# Patient Record
Sex: Male | Born: 1935 | Race: White | Hispanic: No | Marital: Single | Smoking: Former smoker
Health system: Southern US, Community
[De-identification: ages and names within clinical notes are randomized; demographics above are authoritative.]

## PROBLEM LIST (undated history)

## (undated) DIAGNOSIS — I8390 Asymptomatic varicose veins of unspecified lower extremity: Secondary | ICD-10-CM

## (undated) DIAGNOSIS — I10 Essential (primary) hypertension: Secondary | ICD-10-CM

## (undated) DIAGNOSIS — I82409 Acute embolism and thrombosis of unspecified deep veins of unspecified lower extremity: Secondary | ICD-10-CM

## (undated) DIAGNOSIS — G20 Parkinson's disease: Secondary | ICD-10-CM

## (undated) DIAGNOSIS — Z7409 Other reduced mobility: Secondary | ICD-10-CM

## (undated) DIAGNOSIS — Z9181 History of falling: Secondary | ICD-10-CM

## (undated) DIAGNOSIS — E538 Deficiency of other specified B group vitamins: Secondary | ICD-10-CM

## (undated) DIAGNOSIS — G8929 Other chronic pain: Secondary | ICD-10-CM

## (undated) DIAGNOSIS — N189 Chronic kidney disease, unspecified: Secondary | ICD-10-CM

## (undated) DIAGNOSIS — F419 Anxiety disorder, unspecified: Secondary | ICD-10-CM

## (undated) DIAGNOSIS — M329 Systemic lupus erythematosus, unspecified: Secondary | ICD-10-CM

## (undated) DIAGNOSIS — C801 Malignant (primary) neoplasm, unspecified: Secondary | ICD-10-CM

## (undated) DIAGNOSIS — G20A1 Parkinson's disease without dyskinesia, without mention of fluctuations: Secondary | ICD-10-CM

## (undated) HISTORY — DX: Other chronic pain: G89.29

## (undated) HISTORY — DX: Systemic lupus erythematosus, unspecified: M32.9

## (undated) HISTORY — DX: Chronic kidney disease, unspecified: N18.9

## (undated) HISTORY — DX: History of falling: Z91.81

## (undated) HISTORY — DX: Other reduced mobility: Z74.09

## (undated) HISTORY — DX: Parkinson's disease without dyskinesia, without mention of fluctuations: G20.A1

## (undated) HISTORY — DX: Malignant (primary) neoplasm, unspecified: C80.1

## (undated) HISTORY — PX: COLON SURGERY: SHX602

## (undated) HISTORY — DX: Acute embolism and thrombosis of unspecified deep veins of unspecified lower extremity: I82.409

## (undated) HISTORY — DX: Deficiency of other specified B group vitamins: E53.8

## (undated) HISTORY — DX: Anxiety disorder, unspecified: F41.9

## (undated) HISTORY — DX: Essential (primary) hypertension: I10

## (undated) HISTORY — PX: HERNIA REPAIR: SHX51

## (undated) HISTORY — DX: Asymptomatic varicose veins of unspecified lower extremity: I83.90

## (undated) HISTORY — DX: Parkinson's disease: G20

---

## 1997-10-08 ENCOUNTER — Ambulatory Visit (HOSPITAL_BASED_OUTPATIENT_CLINIC_OR_DEPARTMENT_OTHER): Admission: RE | Admit: 1997-10-08 | Discharge: 1997-10-08 | Payer: Self-pay | Admitting: Orthopedic Surgery

## 2003-01-01 ENCOUNTER — Ambulatory Visit (HOSPITAL_BASED_OUTPATIENT_CLINIC_OR_DEPARTMENT_OTHER): Admission: RE | Admit: 2003-01-01 | Discharge: 2003-01-01 | Payer: Self-pay | Admitting: Internal Medicine

## 2008-03-03 ENCOUNTER — Ambulatory Visit: Payer: Self-pay | Admitting: Oncology

## 2008-03-15 LAB — CBC WITH DIFFERENTIAL/PLATELET
BASO%: 0.4 % (ref 0.0–2.0)
Basophils Absolute: 0 10e3/uL (ref 0.0–0.1)
EOS%: 1.5 % (ref 0.0–7.0)
Eosinophils Absolute: 0.1 10e3/uL (ref 0.0–0.5)
HCT: 42.2 % (ref 38.7–49.9)
HGB: 14.3 g/dL (ref 13.0–17.1)
LYMPH%: 25.4 % (ref 14.0–48.0)
MCH: 31.9 pg (ref 28.0–33.4)
MCHC: 33.9 g/dL (ref 32.0–35.9)
MCV: 94.2 fL (ref 81.6–98.0)
MONO#: 0.4 10e3/uL (ref 0.1–0.9)
MONO%: 6.8 % (ref 0.0–13.0)
NEUT#: 3.9 10e3/uL (ref 1.5–6.5)
NEUT%: 65.9 % (ref 40.0–75.0)
Platelets: 226 10e3/uL (ref 145–400)
RBC: 4.48 10e6/uL (ref 4.20–5.71)
RDW: 14.4 % (ref 11.2–14.6)
WBC: 5.9 10e3/uL (ref 4.0–10.0)
lymph#: 1.5 10e3/uL (ref 0.9–3.3)

## 2008-03-18 LAB — HYPERCOAGULABLE PANEL, COMPREHENSIVE RET.
AntiThromb III Func: 109 % (ref 76–126)
Anticardiolipin IgA: <7 [APL'U] (ref <13)
Anticardiolipin IgM: <7 [MPL'U] (ref <10)
Beta-2 Glyco I IgG: <4 U/mL (ref <20)
Beta-2-Glycoprotein I IgA: <4 U/mL (ref <10)
Beta-2-Glycoprotein I IgM: <4 U/mL (ref <10)
Protein S Activity: 47 % — ABNORMAL LOW (ref 69–129)

## 2008-03-18 LAB — COMPREHENSIVE METABOLIC PANEL
BUN: 12 mg/dL (ref 6–23)
CO2: 24 mEq/L (ref 19–32)
Calcium: 9.6 mg/dL (ref 8.4–10.5)
Chloride: 104 mEq/L (ref 96–112)
Creatinine, Ser: 1.01 mg/dL (ref 0.40–1.50)
Glucose, Bld: 98 mg/dL (ref 70–99)

## 2008-04-20 ENCOUNTER — Ambulatory Visit: Payer: Self-pay | Admitting: Oncology

## 2010-04-02 ENCOUNTER — Encounter: Payer: Self-pay | Admitting: Thoracic Surgery

## 2010-10-03 ENCOUNTER — Other Ambulatory Visit: Payer: Self-pay | Admitting: Neurosurgery

## 2010-10-03 DIAGNOSIS — M79603 Pain in arm, unspecified: Secondary | ICD-10-CM

## 2010-10-03 DIAGNOSIS — M542 Cervicalgia: Secondary | ICD-10-CM

## 2010-10-06 MED ORDER — DIAZEPAM 2 MG PO TABS
5.0000 mg | ORAL_TABLET | Freq: Once | ORAL | Status: AC | PRN
  Administered 2010-10-09: 5 mg via ORAL

## 2010-10-09 ENCOUNTER — Ambulatory Visit
Admission: RE | Admit: 2010-10-09 | Discharge: 2010-10-09 | Disposition: A | Discharge status: Home / Self Care | Payer: Self-pay | Source: Ambulatory Visit | Attending: Neurosurgery | Admitting: Neurosurgery

## 2010-10-09 DIAGNOSIS — M542 Cervicalgia: Secondary | ICD-10-CM

## 2010-10-09 DIAGNOSIS — M79603 Pain in arm, unspecified: Secondary | ICD-10-CM

## 2010-10-09 MED ORDER — MEPERIDINE HCL 100 MG/ML IJ SOLN
75.0000 mg | Freq: Once | INTRAMUSCULAR | Status: AC
  Administered 2010-10-09: 75 mg via INTRAMUSCULAR

## 2010-10-09 MED ORDER — SODIUM CHLORIDE 0.9 % IV SOLN: 4.0000 mg | Freq: Four times a day (QID) | INTRAVENOUS | Status: DC | PRN

## 2010-10-09 MED ORDER — IOHEXOL 300 MG/ML  SOLN
10.0000 mL | Freq: Once | INTRAMUSCULAR | Status: AC | PRN
  Administered 2010-10-09: 10 mL via INTRATHECAL

## 2010-10-09 MED ORDER — DEXTROSE-NACL 5-0.45 % IV SOLN: INTRAVENOUS | Status: DC

## 2010-10-09 MED ORDER — ONDANSETRON HCL 4 MG/2ML IJ SOLN
4.0000 mg | Freq: Once | INTRAMUSCULAR | Status: AC
  Administered 2010-10-09: 4 mg via INTRAMUSCULAR

## 2010-10-09 NOTE — Progress Notes (Signed)
Pt "much more comfortable" after CT and upright films "since pain medicine working now."  jkl

## 2010-12-04 ENCOUNTER — Other Ambulatory Visit (HOSPITAL_COMMUNITY): Payer: Self-pay

## 2010-12-05 ENCOUNTER — Inpatient Hospital Stay (HOSPITAL_COMMUNITY): Admission: RE | Admit: 2010-12-05 | Payer: Medicare Other | Source: Ambulatory Visit | Admitting: Neurosurgery

## 2011-03-19 DIAGNOSIS — R791 Abnormal coagulation profile: Secondary | ICD-10-CM | POA: Diagnosis not present

## 2011-04-05 DIAGNOSIS — M542 Cervicalgia: Secondary | ICD-10-CM | POA: Diagnosis not present

## 2011-04-05 DIAGNOSIS — Z7901 Long term (current) use of anticoagulants: Secondary | ICD-10-CM | POA: Diagnosis not present

## 2011-04-05 DIAGNOSIS — Z0000 Encounter for general adult medical examination without abnormal findings: Secondary | ICD-10-CM | POA: Diagnosis not present

## 2011-04-05 DIAGNOSIS — N402 Nodular prostate without lower urinary tract symptoms: Secondary | ICD-10-CM | POA: Diagnosis not present

## 2011-04-05 DIAGNOSIS — E782 Mixed hyperlipidemia: Secondary | ICD-10-CM | POA: Diagnosis not present

## 2011-04-11 DIAGNOSIS — R1013 Epigastric pain: Secondary | ICD-10-CM | POA: Diagnosis not present

## 2011-04-11 DIAGNOSIS — R198 Other specified symptoms and signs involving the digestive system and abdomen: Secondary | ICD-10-CM | POA: Diagnosis not present

## 2011-04-12 DIAGNOSIS — M542 Cervicalgia: Secondary | ICD-10-CM | POA: Diagnosis not present

## 2011-04-12 DIAGNOSIS — M4712 Other spondylosis with myelopathy, cervical region: Secondary | ICD-10-CM | POA: Diagnosis not present

## 2011-04-12 DIAGNOSIS — M5412 Radiculopathy, cervical region: Secondary | ICD-10-CM | POA: Diagnosis not present

## 2011-04-13 DIAGNOSIS — R1013 Epigastric pain: Secondary | ICD-10-CM | POA: Diagnosis not present

## 2011-04-13 DIAGNOSIS — Q618 Other cystic kidney diseases: Secondary | ICD-10-CM | POA: Diagnosis not present

## 2011-04-16 DIAGNOSIS — Z029 Encounter for administrative examinations, unspecified: Secondary | ICD-10-CM | POA: Diagnosis not present

## 2011-05-30 DIAGNOSIS — G20 Parkinson's disease: Secondary | ICD-10-CM | POA: Diagnosis not present

## 2011-05-30 DIAGNOSIS — R413 Other amnesia: Secondary | ICD-10-CM | POA: Diagnosis not present

## 2011-05-30 DIAGNOSIS — R259 Unspecified abnormal involuntary movements: Secondary | ICD-10-CM | POA: Diagnosis not present

## 2011-06-19 DIAGNOSIS — I10 Essential (primary) hypertension: Secondary | ICD-10-CM | POA: Diagnosis not present

## 2011-06-19 DIAGNOSIS — E785 Hyperlipidemia, unspecified: Secondary | ICD-10-CM | POA: Diagnosis not present

## 2011-06-19 DIAGNOSIS — I252 Old myocardial infarction: Secondary | ICD-10-CM | POA: Diagnosis not present

## 2011-06-19 DIAGNOSIS — I2510 Atherosclerotic heart disease of native coronary artery without angina pectoris: Secondary | ICD-10-CM | POA: Diagnosis not present

## 2011-06-21 DIAGNOSIS — I2510 Atherosclerotic heart disease of native coronary artery without angina pectoris: Secondary | ICD-10-CM | POA: Diagnosis not present

## 2011-06-21 DIAGNOSIS — R0602 Shortness of breath: Secondary | ICD-10-CM | POA: Diagnosis not present

## 2011-07-03 DIAGNOSIS — E860 Dehydration: Secondary | ICD-10-CM | POA: Diagnosis not present

## 2011-07-03 DIAGNOSIS — R079 Chest pain, unspecified: Secondary | ICD-10-CM | POA: Diagnosis not present

## 2011-07-03 DIAGNOSIS — G20 Parkinson's disease: Secondary | ICD-10-CM | POA: Diagnosis not present

## 2011-07-03 DIAGNOSIS — R259 Unspecified abnormal involuntary movements: Secondary | ICD-10-CM | POA: Diagnosis not present

## 2011-07-03 DIAGNOSIS — L259 Unspecified contact dermatitis, unspecified cause: Secondary | ICD-10-CM | POA: Diagnosis not present

## 2011-07-03 DIAGNOSIS — E7800 Pure hypercholesterolemia, unspecified: Secondary | ICD-10-CM | POA: Diagnosis not present

## 2011-07-03 DIAGNOSIS — I10 Essential (primary) hypertension: Secondary | ICD-10-CM | POA: Diagnosis not present

## 2011-07-03 DIAGNOSIS — R5381 Other malaise: Secondary | ICD-10-CM | POA: Diagnosis not present

## 2011-07-17 DIAGNOSIS — I10 Essential (primary) hypertension: Secondary | ICD-10-CM | POA: Diagnosis not present

## 2011-07-17 DIAGNOSIS — I2510 Atherosclerotic heart disease of native coronary artery without angina pectoris: Secondary | ICD-10-CM | POA: Diagnosis not present

## 2011-07-17 DIAGNOSIS — E785 Hyperlipidemia, unspecified: Secondary | ICD-10-CM | POA: Diagnosis not present

## 2011-08-02 DIAGNOSIS — F411 Generalized anxiety disorder: Secondary | ICD-10-CM | POA: Diagnosis not present

## 2011-08-02 DIAGNOSIS — M2550 Pain in unspecified joint: Secondary | ICD-10-CM | POA: Diagnosis not present

## 2011-08-09 DIAGNOSIS — G20A1 Parkinson's disease without dyskinesia, without mention of fluctuations: Secondary | ICD-10-CM | POA: Diagnosis not present

## 2011-08-09 DIAGNOSIS — G20 Parkinson's disease: Secondary | ICD-10-CM | POA: Diagnosis not present

## 2011-08-09 DIAGNOSIS — F411 Generalized anxiety disorder: Secondary | ICD-10-CM | POA: Diagnosis not present

## 2011-08-09 DIAGNOSIS — F482 Pseudobulbar affect: Secondary | ICD-10-CM | POA: Diagnosis not present

## 2011-08-09 DIAGNOSIS — R413 Other amnesia: Secondary | ICD-10-CM | POA: Diagnosis not present

## 2011-08-09 DIAGNOSIS — R259 Unspecified abnormal involuntary movements: Secondary | ICD-10-CM | POA: Diagnosis not present

## 2011-08-16 DIAGNOSIS — K5900 Constipation, unspecified: Secondary | ICD-10-CM | POA: Diagnosis not present

## 2011-08-31 DIAGNOSIS — Z86010 Personal history of colonic polyps: Secondary | ICD-10-CM | POA: Diagnosis not present

## 2011-08-31 DIAGNOSIS — Z1211 Encounter for screening for malignant neoplasm of colon: Secondary | ICD-10-CM | POA: Diagnosis not present

## 2011-08-31 DIAGNOSIS — Z79899 Other long term (current) drug therapy: Secondary | ICD-10-CM | POA: Diagnosis not present

## 2011-08-31 DIAGNOSIS — E7800 Pure hypercholesterolemia, unspecified: Secondary | ICD-10-CM | POA: Diagnosis not present

## 2011-08-31 DIAGNOSIS — K219 Gastro-esophageal reflux disease without esophagitis: Secondary | ICD-10-CM | POA: Diagnosis not present

## 2011-09-04 DIAGNOSIS — M542 Cervicalgia: Secondary | ICD-10-CM | POA: Diagnosis not present

## 2011-09-04 DIAGNOSIS — F411 Generalized anxiety disorder: Secondary | ICD-10-CM | POA: Diagnosis not present

## 2011-09-27 DIAGNOSIS — G20 Parkinson's disease: Secondary | ICD-10-CM | POA: Diagnosis not present

## 2011-09-27 DIAGNOSIS — R259 Unspecified abnormal involuntary movements: Secondary | ICD-10-CM | POA: Diagnosis not present

## 2011-09-27 DIAGNOSIS — F411 Generalized anxiety disorder: Secondary | ICD-10-CM | POA: Diagnosis not present

## 2011-09-27 DIAGNOSIS — F482 Pseudobulbar affect: Secondary | ICD-10-CM | POA: Diagnosis not present

## 2011-10-02 DIAGNOSIS — F411 Generalized anxiety disorder: Secondary | ICD-10-CM | POA: Diagnosis not present

## 2011-10-02 DIAGNOSIS — M2550 Pain in unspecified joint: Secondary | ICD-10-CM | POA: Diagnosis not present

## 2011-11-05 DIAGNOSIS — F411 Generalized anxiety disorder: Secondary | ICD-10-CM | POA: Diagnosis not present

## 2011-11-05 DIAGNOSIS — I10 Essential (primary) hypertension: Secondary | ICD-10-CM | POA: Diagnosis not present

## 2011-11-05 DIAGNOSIS — M542 Cervicalgia: Secondary | ICD-10-CM | POA: Diagnosis not present

## 2011-11-21 DIAGNOSIS — R002 Palpitations: Secondary | ICD-10-CM | POA: Diagnosis not present

## 2011-12-06 DIAGNOSIS — M542 Cervicalgia: Secondary | ICD-10-CM | POA: Diagnosis not present

## 2011-12-06 DIAGNOSIS — F411 Generalized anxiety disorder: Secondary | ICD-10-CM | POA: Diagnosis not present

## 2011-12-06 DIAGNOSIS — I10 Essential (primary) hypertension: Secondary | ICD-10-CM | POA: Diagnosis not present

## 2011-12-06 DIAGNOSIS — Z23 Encounter for immunization: Secondary | ICD-10-CM | POA: Diagnosis not present

## 2011-12-20 DIAGNOSIS — R002 Palpitations: Secondary | ICD-10-CM | POA: Diagnosis not present

## 2011-12-28 DIAGNOSIS — M5412 Radiculopathy, cervical region: Secondary | ICD-10-CM | POA: Diagnosis not present

## 2011-12-28 DIAGNOSIS — F411 Generalized anxiety disorder: Secondary | ICD-10-CM | POA: Diagnosis not present

## 2011-12-28 DIAGNOSIS — G20 Parkinson's disease: Secondary | ICD-10-CM | POA: Diagnosis not present

## 2011-12-28 DIAGNOSIS — F482 Pseudobulbar affect: Secondary | ICD-10-CM | POA: Diagnosis not present

## 2012-01-04 DIAGNOSIS — I10 Essential (primary) hypertension: Secondary | ICD-10-CM | POA: Diagnosis not present

## 2012-01-04 DIAGNOSIS — M542 Cervicalgia: Secondary | ICD-10-CM | POA: Diagnosis not present

## 2012-01-04 DIAGNOSIS — F411 Generalized anxiety disorder: Secondary | ICD-10-CM | POA: Diagnosis not present

## 2012-02-04 DIAGNOSIS — M542 Cervicalgia: Secondary | ICD-10-CM | POA: Diagnosis not present

## 2012-02-04 DIAGNOSIS — I10 Essential (primary) hypertension: Secondary | ICD-10-CM | POA: Diagnosis not present

## 2012-02-04 DIAGNOSIS — F411 Generalized anxiety disorder: Secondary | ICD-10-CM | POA: Diagnosis not present

## 2012-02-29 DIAGNOSIS — I10 Essential (primary) hypertension: Secondary | ICD-10-CM | POA: Diagnosis not present

## 2012-02-29 DIAGNOSIS — M542 Cervicalgia: Secondary | ICD-10-CM | POA: Diagnosis not present

## 2012-02-29 DIAGNOSIS — F411 Generalized anxiety disorder: Secondary | ICD-10-CM | POA: Diagnosis not present

## 2012-03-31 DIAGNOSIS — I10 Essential (primary) hypertension: Secondary | ICD-10-CM | POA: Diagnosis not present

## 2012-03-31 DIAGNOSIS — N402 Nodular prostate without lower urinary tract symptoms: Secondary | ICD-10-CM | POA: Diagnosis not present

## 2012-06-26 DIAGNOSIS — M2550 Pain in unspecified joint: Secondary | ICD-10-CM | POA: Diagnosis not present

## 2012-06-26 DIAGNOSIS — F411 Generalized anxiety disorder: Secondary | ICD-10-CM | POA: Diagnosis not present

## 2012-07-11 DIAGNOSIS — G20 Parkinson's disease: Secondary | ICD-10-CM | POA: Diagnosis not present

## 2012-07-11 DIAGNOSIS — M5412 Radiculopathy, cervical region: Secondary | ICD-10-CM | POA: Diagnosis not present

## 2012-07-11 DIAGNOSIS — F482 Pseudobulbar affect: Secondary | ICD-10-CM | POA: Diagnosis not present

## 2012-07-11 DIAGNOSIS — F411 Generalized anxiety disorder: Secondary | ICD-10-CM | POA: Diagnosis not present

## 2012-07-23 DIAGNOSIS — L923 Foreign body granuloma of the skin and subcutaneous tissue: Secondary | ICD-10-CM | POA: Diagnosis not present

## 2012-07-25 DIAGNOSIS — I10 Essential (primary) hypertension: Secondary | ICD-10-CM | POA: Diagnosis not present

## 2012-07-25 DIAGNOSIS — M542 Cervicalgia: Secondary | ICD-10-CM | POA: Diagnosis not present

## 2012-07-25 DIAGNOSIS — F411 Generalized anxiety disorder: Secondary | ICD-10-CM | POA: Diagnosis not present

## 2012-07-25 DIAGNOSIS — Z1331 Encounter for screening for depression: Secondary | ICD-10-CM | POA: Diagnosis not present

## 2012-08-11 DIAGNOSIS — I2510 Atherosclerotic heart disease of native coronary artery without angina pectoris: Secondary | ICD-10-CM | POA: Diagnosis not present

## 2012-08-11 DIAGNOSIS — I10 Essential (primary) hypertension: Secondary | ICD-10-CM | POA: Diagnosis not present

## 2012-08-11 DIAGNOSIS — Z7901 Long term (current) use of anticoagulants: Secondary | ICD-10-CM | POA: Diagnosis not present

## 2012-08-11 DIAGNOSIS — Z79899 Other long term (current) drug therapy: Secondary | ICD-10-CM | POA: Diagnosis not present

## 2012-08-11 DIAGNOSIS — G20 Parkinson's disease: Secondary | ICD-10-CM | POA: Diagnosis not present

## 2012-08-11 DIAGNOSIS — L923 Foreign body granuloma of the skin and subcutaneous tissue: Secondary | ICD-10-CM | POA: Diagnosis not present

## 2012-08-11 DIAGNOSIS — M795 Residual foreign body in soft tissue: Secondary | ICD-10-CM | POA: Diagnosis not present

## 2012-08-11 DIAGNOSIS — E7800 Pure hypercholesterolemia, unspecified: Secondary | ICD-10-CM | POA: Diagnosis not present

## 2012-08-11 DIAGNOSIS — IMO0002 Reserved for concepts with insufficient information to code with codable children: Secondary | ICD-10-CM | POA: Diagnosis not present

## 2012-08-11 DIAGNOSIS — I252 Old myocardial infarction: Secondary | ICD-10-CM | POA: Diagnosis not present

## 2012-08-11 DIAGNOSIS — Z0389 Encounter for observation for other suspected diseases and conditions ruled out: Secondary | ICD-10-CM | POA: Diagnosis not present

## 2012-08-14 DIAGNOSIS — H5210 Myopia, unspecified eye: Secondary | ICD-10-CM | POA: Diagnosis not present

## 2012-08-14 DIAGNOSIS — H2510 Age-related nuclear cataract, unspecified eye: Secondary | ICD-10-CM | POA: Diagnosis not present

## 2012-08-25 DIAGNOSIS — F411 Generalized anxiety disorder: Secondary | ICD-10-CM | POA: Diagnosis not present

## 2012-08-25 DIAGNOSIS — M542 Cervicalgia: Secondary | ICD-10-CM | POA: Diagnosis not present

## 2012-08-25 DIAGNOSIS — I10 Essential (primary) hypertension: Secondary | ICD-10-CM | POA: Diagnosis not present

## 2012-09-09 DIAGNOSIS — N189 Chronic kidney disease, unspecified: Secondary | ICD-10-CM | POA: Diagnosis not present

## 2012-09-09 DIAGNOSIS — K648 Other hemorrhoids: Secondary | ICD-10-CM | POA: Diagnosis not present

## 2012-09-09 DIAGNOSIS — I472 Ventricular tachycardia: Secondary | ICD-10-CM | POA: Diagnosis not present

## 2012-09-09 DIAGNOSIS — I959 Hypotension, unspecified: Secondary | ICD-10-CM | POA: Diagnosis not present

## 2012-09-09 DIAGNOSIS — N179 Acute kidney failure, unspecified: Secondary | ICD-10-CM | POA: Diagnosis not present

## 2012-09-09 DIAGNOSIS — R42 Dizziness and giddiness: Secondary | ICD-10-CM | POA: Diagnosis not present

## 2012-09-09 DIAGNOSIS — G20 Parkinson's disease: Secondary | ICD-10-CM | POA: Diagnosis not present

## 2012-09-09 DIAGNOSIS — E538 Deficiency of other specified B group vitamins: Secondary | ICD-10-CM | POA: Diagnosis not present

## 2012-09-09 DIAGNOSIS — D509 Iron deficiency anemia, unspecified: Secondary | ICD-10-CM | POA: Diagnosis not present

## 2012-09-09 DIAGNOSIS — I129 Hypertensive chronic kidney disease with stage 1 through stage 4 chronic kidney disease, or unspecified chronic kidney disease: Secondary | ICD-10-CM | POA: Diagnosis not present

## 2012-09-09 DIAGNOSIS — R079 Chest pain, unspecified: Secondary | ICD-10-CM | POA: Diagnosis not present

## 2012-09-09 DIAGNOSIS — R0602 Shortness of breath: Secondary | ICD-10-CM | POA: Diagnosis not present

## 2012-09-10 DIAGNOSIS — R195 Other fecal abnormalities: Secondary | ICD-10-CM | POA: Diagnosis not present

## 2012-09-10 DIAGNOSIS — D638 Anemia in other chronic diseases classified elsewhere: Secondary | ICD-10-CM | POA: Diagnosis not present

## 2012-09-10 DIAGNOSIS — D126 Benign neoplasm of colon, unspecified: Secondary | ICD-10-CM | POA: Diagnosis not present

## 2012-09-10 DIAGNOSIS — K219 Gastro-esophageal reflux disease without esophagitis: Secondary | ICD-10-CM | POA: Diagnosis not present

## 2012-09-10 DIAGNOSIS — Z0389 Encounter for observation for other suspected diseases and conditions ruled out: Secondary | ICD-10-CM | POA: Diagnosis not present

## 2012-09-10 DIAGNOSIS — D500 Iron deficiency anemia secondary to blood loss (chronic): Secondary | ICD-10-CM | POA: Diagnosis not present

## 2012-09-10 DIAGNOSIS — I2510 Atherosclerotic heart disease of native coronary artery without angina pectoris: Secondary | ICD-10-CM | POA: Diagnosis not present

## 2012-09-10 DIAGNOSIS — R079 Chest pain, unspecified: Secondary | ICD-10-CM | POA: Diagnosis not present

## 2012-09-10 DIAGNOSIS — R0789 Other chest pain: Secondary | ICD-10-CM | POA: Diagnosis not present

## 2012-09-11 DIAGNOSIS — I4891 Unspecified atrial fibrillation: Secondary | ICD-10-CM | POA: Diagnosis not present

## 2012-09-11 DIAGNOSIS — G8929 Other chronic pain: Secondary | ICD-10-CM | POA: Diagnosis present

## 2012-09-11 DIAGNOSIS — N401 Enlarged prostate with lower urinary tract symptoms: Secondary | ICD-10-CM | POA: Diagnosis not present

## 2012-09-11 DIAGNOSIS — N400 Enlarged prostate without lower urinary tract symptoms: Secondary | ICD-10-CM | POA: Diagnosis present

## 2012-09-11 DIAGNOSIS — D500 Iron deficiency anemia secondary to blood loss (chronic): Secondary | ICD-10-CM | POA: Diagnosis not present

## 2012-09-11 DIAGNOSIS — K922 Gastrointestinal hemorrhage, unspecified: Secondary | ICD-10-CM | POA: Diagnosis not present

## 2012-09-11 DIAGNOSIS — I129 Hypertensive chronic kidney disease with stage 1 through stage 4 chronic kidney disease, or unspecified chronic kidney disease: Secondary | ICD-10-CM | POA: Diagnosis present

## 2012-09-11 DIAGNOSIS — G43909 Migraine, unspecified, not intractable, without status migrainosus: Secondary | ICD-10-CM | POA: Diagnosis present

## 2012-09-11 DIAGNOSIS — Z85038 Personal history of other malignant neoplasm of large intestine: Secondary | ICD-10-CM | POA: Diagnosis not present

## 2012-09-11 DIAGNOSIS — R195 Other fecal abnormalities: Secondary | ICD-10-CM | POA: Diagnosis not present

## 2012-09-11 DIAGNOSIS — Z86718 Personal history of other venous thrombosis and embolism: Secondary | ICD-10-CM | POA: Diagnosis not present

## 2012-09-11 DIAGNOSIS — I472 Ventricular tachycardia: Secondary | ICD-10-CM | POA: Diagnosis not present

## 2012-09-11 DIAGNOSIS — F329 Major depressive disorder, single episode, unspecified: Secondary | ICD-10-CM | POA: Diagnosis present

## 2012-09-11 DIAGNOSIS — D649 Anemia, unspecified: Secondary | ICD-10-CM | POA: Diagnosis not present

## 2012-09-11 DIAGNOSIS — D509 Iron deficiency anemia, unspecified: Secondary | ICD-10-CM | POA: Diagnosis not present

## 2012-09-11 DIAGNOSIS — Z7901 Long term (current) use of anticoagulants: Secondary | ICD-10-CM | POA: Diagnosis not present

## 2012-09-11 DIAGNOSIS — R42 Dizziness and giddiness: Secondary | ICD-10-CM | POA: Diagnosis not present

## 2012-09-11 DIAGNOSIS — R0789 Other chest pain: Secondary | ICD-10-CM | POA: Diagnosis not present

## 2012-09-11 DIAGNOSIS — R300 Dysuria: Secondary | ICD-10-CM | POA: Diagnosis present

## 2012-09-11 DIAGNOSIS — I2510 Atherosclerotic heart disease of native coronary artery without angina pectoris: Secondary | ICD-10-CM | POA: Diagnosis not present

## 2012-09-11 DIAGNOSIS — D126 Benign neoplasm of colon, unspecified: Secondary | ICD-10-CM | POA: Diagnosis present

## 2012-09-11 DIAGNOSIS — D62 Acute posthemorrhagic anemia: Secondary | ICD-10-CM | POA: Diagnosis not present

## 2012-09-11 DIAGNOSIS — G20 Parkinson's disease: Secondary | ICD-10-CM | POA: Diagnosis not present

## 2012-09-11 DIAGNOSIS — K648 Other hemorrhoids: Secondary | ICD-10-CM | POA: Diagnosis not present

## 2012-09-11 DIAGNOSIS — K5730 Diverticulosis of large intestine without perforation or abscess without bleeding: Secondary | ICD-10-CM | POA: Diagnosis present

## 2012-09-11 DIAGNOSIS — K449 Diaphragmatic hernia without obstruction or gangrene: Secondary | ICD-10-CM | POA: Diagnosis present

## 2012-09-11 DIAGNOSIS — E538 Deficiency of other specified B group vitamins: Secondary | ICD-10-CM | POA: Diagnosis present

## 2012-09-11 DIAGNOSIS — E785 Hyperlipidemia, unspecified: Secondary | ICD-10-CM | POA: Diagnosis present

## 2012-09-11 DIAGNOSIS — K5909 Other constipation: Secondary | ICD-10-CM | POA: Diagnosis present

## 2012-09-11 DIAGNOSIS — I252 Old myocardial infarction: Secondary | ICD-10-CM | POA: Diagnosis not present

## 2012-09-11 DIAGNOSIS — Z86711 Personal history of pulmonary embolism: Secondary | ICD-10-CM | POA: Diagnosis not present

## 2012-09-11 DIAGNOSIS — N189 Chronic kidney disease, unspecified: Secondary | ICD-10-CM | POA: Diagnosis not present

## 2012-09-11 DIAGNOSIS — K219 Gastro-esophageal reflux disease without esophagitis: Secondary | ICD-10-CM | POA: Diagnosis present

## 2012-09-11 DIAGNOSIS — N179 Acute kidney failure, unspecified: Secondary | ICD-10-CM | POA: Diagnosis not present

## 2012-09-17 DIAGNOSIS — D62 Acute posthemorrhagic anemia: Secondary | ICD-10-CM | POA: Diagnosis not present

## 2012-09-17 DIAGNOSIS — K922 Gastrointestinal hemorrhage, unspecified: Secondary | ICD-10-CM | POA: Diagnosis not present

## 2012-09-17 DIAGNOSIS — I824Z9 Acute embolism and thrombosis of unspecified deep veins of unspecified distal lower extremity: Secondary | ICD-10-CM | POA: Diagnosis not present

## 2012-09-17 DIAGNOSIS — N189 Chronic kidney disease, unspecified: Secondary | ICD-10-CM | POA: Diagnosis not present

## 2012-09-22 DIAGNOSIS — K649 Unspecified hemorrhoids: Secondary | ICD-10-CM | POA: Diagnosis not present

## 2012-09-22 DIAGNOSIS — F411 Generalized anxiety disorder: Secondary | ICD-10-CM | POA: Diagnosis not present

## 2012-09-22 DIAGNOSIS — D649 Anemia, unspecified: Secondary | ICD-10-CM | POA: Diagnosis not present

## 2012-09-22 DIAGNOSIS — D518 Other vitamin B12 deficiency anemias: Secondary | ICD-10-CM | POA: Diagnosis not present

## 2012-09-25 DIAGNOSIS — R5383 Other fatigue: Secondary | ICD-10-CM | POA: Diagnosis not present

## 2012-09-25 DIAGNOSIS — R5381 Other malaise: Secondary | ICD-10-CM | POA: Diagnosis not present

## 2012-09-26 DIAGNOSIS — R3915 Urgency of urination: Secondary | ICD-10-CM | POA: Diagnosis not present

## 2012-09-26 DIAGNOSIS — N401 Enlarged prostate with lower urinary tract symptoms: Secondary | ICD-10-CM | POA: Diagnosis not present

## 2012-09-30 DIAGNOSIS — D62 Acute posthemorrhagic anemia: Secondary | ICD-10-CM | POA: Diagnosis not present

## 2012-10-06 DIAGNOSIS — D62 Acute posthemorrhagic anemia: Secondary | ICD-10-CM | POA: Diagnosis not present

## 2012-10-09 DIAGNOSIS — N179 Acute kidney failure, unspecified: Secondary | ICD-10-CM | POA: Diagnosis not present

## 2012-10-09 DIAGNOSIS — R0989 Other specified symptoms and signs involving the circulatory and respiratory systems: Secondary | ICD-10-CM | POA: Diagnosis not present

## 2012-10-09 DIAGNOSIS — I252 Old myocardial infarction: Secondary | ICD-10-CM | POA: Diagnosis not present

## 2012-10-09 DIAGNOSIS — J156 Pneumonia due to other aerobic Gram-negative bacteria: Secondary | ICD-10-CM | POA: Diagnosis not present

## 2012-10-09 DIAGNOSIS — G20 Parkinson's disease: Secondary | ICD-10-CM | POA: Diagnosis present

## 2012-10-09 DIAGNOSIS — D649 Anemia, unspecified: Secondary | ICD-10-CM | POA: Diagnosis not present

## 2012-10-09 DIAGNOSIS — Z87891 Personal history of nicotine dependence: Secondary | ICD-10-CM | POA: Diagnosis not present

## 2012-10-09 DIAGNOSIS — R0609 Other forms of dyspnea: Secondary | ICD-10-CM | POA: Diagnosis not present

## 2012-10-09 DIAGNOSIS — Z86711 Personal history of pulmonary embolism: Secondary | ICD-10-CM | POA: Diagnosis not present

## 2012-10-09 DIAGNOSIS — Z8719 Personal history of other diseases of the digestive system: Secondary | ICD-10-CM | POA: Diagnosis not present

## 2012-10-09 DIAGNOSIS — J1569 Pneumonia due to other gram-negative bacteria: Secondary | ICD-10-CM | POA: Diagnosis not present

## 2012-10-09 DIAGNOSIS — J189 Pneumonia, unspecified organism: Secondary | ICD-10-CM | POA: Diagnosis not present

## 2012-10-09 DIAGNOSIS — J4489 Other specified chronic obstructive pulmonary disease: Secondary | ICD-10-CM | POA: Diagnosis not present

## 2012-10-09 DIAGNOSIS — N184 Chronic kidney disease, stage 4 (severe): Secondary | ICD-10-CM | POA: Diagnosis not present

## 2012-10-09 DIAGNOSIS — J449 Chronic obstructive pulmonary disease, unspecified: Secondary | ICD-10-CM | POA: Diagnosis not present

## 2012-10-09 DIAGNOSIS — Z86718 Personal history of other venous thrombosis and embolism: Secondary | ICD-10-CM | POA: Diagnosis not present

## 2012-10-09 DIAGNOSIS — I129 Hypertensive chronic kidney disease with stage 1 through stage 4 chronic kidney disease, or unspecified chronic kidney disease: Secondary | ICD-10-CM | POA: Diagnosis not present

## 2012-10-09 DIAGNOSIS — J9600 Acute respiratory failure, unspecified whether with hypoxia or hypercapnia: Secondary | ICD-10-CM | POA: Diagnosis not present

## 2012-10-09 DIAGNOSIS — R079 Chest pain, unspecified: Secondary | ICD-10-CM | POA: Diagnosis not present

## 2012-10-09 DIAGNOSIS — R0602 Shortness of breath: Secondary | ICD-10-CM | POA: Diagnosis not present

## 2012-10-09 DIAGNOSIS — Z79899 Other long term (current) drug therapy: Secondary | ICD-10-CM | POA: Diagnosis not present

## 2012-10-09 DIAGNOSIS — I2510 Atherosclerotic heart disease of native coronary artery without angina pectoris: Secondary | ICD-10-CM | POA: Diagnosis not present

## 2012-10-09 DIAGNOSIS — N400 Enlarged prostate without lower urinary tract symptoms: Secondary | ICD-10-CM | POA: Diagnosis present

## 2012-10-09 DIAGNOSIS — R0902 Hypoxemia: Secondary | ICD-10-CM | POA: Diagnosis not present

## 2012-10-09 DIAGNOSIS — N289 Disorder of kidney and ureter, unspecified: Secondary | ICD-10-CM | POA: Diagnosis not present

## 2012-10-09 DIAGNOSIS — Z7901 Long term (current) use of anticoagulants: Secondary | ICD-10-CM | POA: Diagnosis not present

## 2012-10-16 DIAGNOSIS — F411 Generalized anxiety disorder: Secondary | ICD-10-CM | POA: Diagnosis not present

## 2012-10-16 DIAGNOSIS — E538 Deficiency of other specified B group vitamins: Secondary | ICD-10-CM | POA: Diagnosis not present

## 2012-10-16 DIAGNOSIS — M542 Cervicalgia: Secondary | ICD-10-CM | POA: Diagnosis not present

## 2012-10-16 DIAGNOSIS — I959 Hypotension, unspecified: Secondary | ICD-10-CM | POA: Diagnosis not present

## 2012-10-30 DIAGNOSIS — D649 Anemia, unspecified: Secondary | ICD-10-CM | POA: Diagnosis not present

## 2012-10-30 DIAGNOSIS — I959 Hypotension, unspecified: Secondary | ICD-10-CM | POA: Diagnosis not present

## 2012-11-14 DIAGNOSIS — F411 Generalized anxiety disorder: Secondary | ICD-10-CM | POA: Diagnosis not present

## 2012-11-14 DIAGNOSIS — M542 Cervicalgia: Secondary | ICD-10-CM | POA: Diagnosis not present

## 2012-12-04 DIAGNOSIS — G20 Parkinson's disease: Secondary | ICD-10-CM | POA: Diagnosis not present

## 2012-12-04 DIAGNOSIS — F482 Pseudobulbar affect: Secondary | ICD-10-CM | POA: Diagnosis not present

## 2012-12-04 DIAGNOSIS — M5412 Radiculopathy, cervical region: Secondary | ICD-10-CM | POA: Diagnosis not present

## 2012-12-04 DIAGNOSIS — F411 Generalized anxiety disorder: Secondary | ICD-10-CM | POA: Diagnosis not present

## 2012-12-08 DIAGNOSIS — M542 Cervicalgia: Secondary | ICD-10-CM | POA: Diagnosis not present

## 2012-12-08 DIAGNOSIS — E538 Deficiency of other specified B group vitamins: Secondary | ICD-10-CM | POA: Diagnosis not present

## 2012-12-08 DIAGNOSIS — Z23 Encounter for immunization: Secondary | ICD-10-CM | POA: Diagnosis not present

## 2012-12-08 DIAGNOSIS — F411 Generalized anxiety disorder: Secondary | ICD-10-CM | POA: Diagnosis not present

## 2013-01-07 DIAGNOSIS — I959 Hypotension, unspecified: Secondary | ICD-10-CM | POA: Diagnosis not present

## 2013-01-07 DIAGNOSIS — F411 Generalized anxiety disorder: Secondary | ICD-10-CM | POA: Diagnosis not present

## 2013-01-07 DIAGNOSIS — M542 Cervicalgia: Secondary | ICD-10-CM | POA: Diagnosis not present

## 2013-01-20 DIAGNOSIS — F411 Generalized anxiety disorder: Secondary | ICD-10-CM | POA: Diagnosis not present

## 2013-01-20 DIAGNOSIS — R110 Nausea: Secondary | ICD-10-CM | POA: Diagnosis not present

## 2013-01-29 DIAGNOSIS — K219 Gastro-esophageal reflux disease without esophagitis: Secondary | ICD-10-CM | POA: Diagnosis not present

## 2013-01-29 DIAGNOSIS — I10 Essential (primary) hypertension: Secondary | ICD-10-CM | POA: Diagnosis not present

## 2013-03-09 DIAGNOSIS — E538 Deficiency of other specified B group vitamins: Secondary | ICD-10-CM | POA: Diagnosis not present

## 2013-03-09 DIAGNOSIS — F411 Generalized anxiety disorder: Secondary | ICD-10-CM | POA: Diagnosis not present

## 2013-03-09 DIAGNOSIS — I959 Hypotension, unspecified: Secondary | ICD-10-CM | POA: Diagnosis not present

## 2013-03-09 DIAGNOSIS — G8929 Other chronic pain: Secondary | ICD-10-CM | POA: Diagnosis not present

## 2013-04-09 DIAGNOSIS — N402 Nodular prostate without lower urinary tract symptoms: Secondary | ICD-10-CM | POA: Diagnosis not present

## 2013-04-09 DIAGNOSIS — E782 Mixed hyperlipidemia: Secondary | ICD-10-CM | POA: Diagnosis not present

## 2013-04-09 DIAGNOSIS — J449 Chronic obstructive pulmonary disease, unspecified: Secondary | ICD-10-CM | POA: Diagnosis not present

## 2013-04-09 DIAGNOSIS — G20 Parkinson's disease: Secondary | ICD-10-CM | POA: Diagnosis not present

## 2013-04-16 DIAGNOSIS — I2510 Atherosclerotic heart disease of native coronary artery without angina pectoris: Secondary | ICD-10-CM | POA: Diagnosis not present

## 2013-04-16 DIAGNOSIS — I10 Essential (primary) hypertension: Secondary | ICD-10-CM | POA: Diagnosis not present

## 2013-04-16 DIAGNOSIS — E785 Hyperlipidemia, unspecified: Secondary | ICD-10-CM | POA: Diagnosis not present

## 2013-04-17 DIAGNOSIS — E785 Hyperlipidemia, unspecified: Secondary | ICD-10-CM | POA: Diagnosis not present

## 2013-04-17 DIAGNOSIS — I10 Essential (primary) hypertension: Secondary | ICD-10-CM | POA: Diagnosis not present

## 2013-04-17 DIAGNOSIS — I252 Old myocardial infarction: Secondary | ICD-10-CM | POA: Diagnosis not present

## 2013-04-17 DIAGNOSIS — I2510 Atherosclerotic heart disease of native coronary artery without angina pectoris: Secondary | ICD-10-CM | POA: Diagnosis not present

## 2013-05-06 DIAGNOSIS — F411 Generalized anxiety disorder: Secondary | ICD-10-CM | POA: Diagnosis not present

## 2013-05-06 DIAGNOSIS — J449 Chronic obstructive pulmonary disease, unspecified: Secondary | ICD-10-CM | POA: Diagnosis not present

## 2013-05-06 DIAGNOSIS — I10 Essential (primary) hypertension: Secondary | ICD-10-CM | POA: Diagnosis not present

## 2013-05-06 DIAGNOSIS — R071 Chest pain on breathing: Secondary | ICD-10-CM | POA: Diagnosis not present

## 2013-05-25 DIAGNOSIS — G4730 Sleep apnea, unspecified: Secondary | ICD-10-CM | POA: Diagnosis not present

## 2013-05-25 DIAGNOSIS — J310 Chronic rhinitis: Secondary | ICD-10-CM | POA: Diagnosis not present

## 2013-05-25 DIAGNOSIS — R0609 Other forms of dyspnea: Secondary | ICD-10-CM | POA: Diagnosis not present

## 2013-05-25 DIAGNOSIS — R5381 Other malaise: Secondary | ICD-10-CM | POA: Diagnosis not present

## 2013-05-25 DIAGNOSIS — R5383 Other fatigue: Secondary | ICD-10-CM | POA: Diagnosis not present

## 2013-05-25 DIAGNOSIS — G4710 Hypersomnia, unspecified: Secondary | ICD-10-CM | POA: Diagnosis not present

## 2013-05-25 DIAGNOSIS — E559 Vitamin D deficiency, unspecified: Secondary | ICD-10-CM | POA: Diagnosis not present

## 2013-05-25 DIAGNOSIS — R0989 Other specified symptoms and signs involving the circulatory and respiratory systems: Secondary | ICD-10-CM | POA: Diagnosis not present

## 2013-05-28 DIAGNOSIS — G20 Parkinson's disease: Secondary | ICD-10-CM | POA: Diagnosis not present

## 2013-05-28 DIAGNOSIS — E860 Dehydration: Secondary | ICD-10-CM | POA: Diagnosis not present

## 2013-05-28 DIAGNOSIS — E7800 Pure hypercholesterolemia, unspecified: Secondary | ICD-10-CM | POA: Diagnosis not present

## 2013-05-28 DIAGNOSIS — D649 Anemia, unspecified: Secondary | ICD-10-CM | POA: Diagnosis not present

## 2013-05-28 DIAGNOSIS — I10 Essential (primary) hypertension: Secondary | ICD-10-CM | POA: Diagnosis not present

## 2013-05-28 DIAGNOSIS — R5381 Other malaise: Secondary | ICD-10-CM | POA: Diagnosis not present

## 2013-05-28 DIAGNOSIS — K219 Gastro-esophageal reflux disease without esophagitis: Secondary | ICD-10-CM | POA: Diagnosis not present

## 2013-05-28 DIAGNOSIS — I959 Hypotension, unspecified: Secondary | ICD-10-CM | POA: Diagnosis not present

## 2013-05-28 DIAGNOSIS — Z86711 Personal history of pulmonary embolism: Secondary | ICD-10-CM | POA: Diagnosis not present

## 2013-05-28 DIAGNOSIS — Z79899 Other long term (current) drug therapy: Secondary | ICD-10-CM | POA: Diagnosis not present

## 2013-05-28 DIAGNOSIS — R079 Chest pain, unspecified: Secondary | ICD-10-CM | POA: Diagnosis not present

## 2013-05-28 DIAGNOSIS — R404 Transient alteration of awareness: Secondary | ICD-10-CM | POA: Diagnosis not present

## 2013-06-01 DIAGNOSIS — E538 Deficiency of other specified B group vitamins: Secondary | ICD-10-CM | POA: Diagnosis not present

## 2013-06-01 DIAGNOSIS — I10 Essential (primary) hypertension: Secondary | ICD-10-CM | POA: Diagnosis not present

## 2013-06-01 DIAGNOSIS — G8929 Other chronic pain: Secondary | ICD-10-CM | POA: Diagnosis not present

## 2013-06-01 DIAGNOSIS — F411 Generalized anxiety disorder: Secondary | ICD-10-CM | POA: Diagnosis not present

## 2013-06-02 DIAGNOSIS — G4730 Sleep apnea, unspecified: Secondary | ICD-10-CM | POA: Diagnosis not present

## 2013-06-02 DIAGNOSIS — I252 Old myocardial infarction: Secondary | ICD-10-CM | POA: Diagnosis not present

## 2013-06-02 DIAGNOSIS — R918 Other nonspecific abnormal finding of lung field: Secondary | ICD-10-CM | POA: Diagnosis not present

## 2013-06-02 DIAGNOSIS — I2510 Atherosclerotic heart disease of native coronary artery without angina pectoris: Secondary | ICD-10-CM | POA: Diagnosis not present

## 2013-06-02 DIAGNOSIS — J984 Other disorders of lung: Secondary | ICD-10-CM | POA: Diagnosis not present

## 2013-06-02 DIAGNOSIS — R0609 Other forms of dyspnea: Secondary | ICD-10-CM | POA: Diagnosis not present

## 2013-06-02 DIAGNOSIS — R0989 Other specified symptoms and signs involving the circulatory and respiratory systems: Secondary | ICD-10-CM | POA: Diagnosis not present

## 2013-06-02 DIAGNOSIS — R079 Chest pain, unspecified: Secondary | ICD-10-CM | POA: Diagnosis not present

## 2013-06-02 DIAGNOSIS — R300 Dysuria: Secondary | ICD-10-CM | POA: Diagnosis not present

## 2013-06-02 DIAGNOSIS — I10 Essential (primary) hypertension: Secondary | ICD-10-CM | POA: Diagnosis not present

## 2013-06-02 DIAGNOSIS — G20 Parkinson's disease: Secondary | ICD-10-CM | POA: Diagnosis not present

## 2013-06-02 DIAGNOSIS — R52 Pain, unspecified: Secondary | ICD-10-CM | POA: Diagnosis not present

## 2013-06-02 DIAGNOSIS — G4710 Hypersomnia, unspecified: Secondary | ICD-10-CM | POA: Diagnosis not present

## 2013-06-02 DIAGNOSIS — F411 Generalized anxiety disorder: Secondary | ICD-10-CM | POA: Diagnosis not present

## 2013-06-02 DIAGNOSIS — Z9181 History of falling: Secondary | ICD-10-CM | POA: Diagnosis not present

## 2013-06-02 DIAGNOSIS — N400 Enlarged prostate without lower urinary tract symptoms: Secondary | ICD-10-CM | POA: Diagnosis not present

## 2013-06-02 DIAGNOSIS — J9819 Other pulmonary collapse: Secondary | ICD-10-CM | POA: Diagnosis not present

## 2013-06-02 DIAGNOSIS — J45909 Unspecified asthma, uncomplicated: Secondary | ICD-10-CM | POA: Diagnosis not present

## 2013-06-03 DIAGNOSIS — G20 Parkinson's disease: Secondary | ICD-10-CM | POA: Diagnosis not present

## 2013-06-03 DIAGNOSIS — F482 Pseudobulbar affect: Secondary | ICD-10-CM | POA: Diagnosis not present

## 2013-06-03 DIAGNOSIS — F411 Generalized anxiety disorder: Secondary | ICD-10-CM | POA: Diagnosis not present

## 2013-06-03 DIAGNOSIS — M5412 Radiculopathy, cervical region: Secondary | ICD-10-CM | POA: Diagnosis not present

## 2013-06-05 DIAGNOSIS — G4710 Hypersomnia, unspecified: Secondary | ICD-10-CM | POA: Diagnosis not present

## 2013-06-05 DIAGNOSIS — I10 Essential (primary) hypertension: Secondary | ICD-10-CM | POA: Diagnosis not present

## 2013-06-05 DIAGNOSIS — G20 Parkinson's disease: Secondary | ICD-10-CM | POA: Diagnosis not present

## 2013-06-05 DIAGNOSIS — R52 Pain, unspecified: Secondary | ICD-10-CM | POA: Diagnosis not present

## 2013-06-05 DIAGNOSIS — F411 Generalized anxiety disorder: Secondary | ICD-10-CM | POA: Diagnosis not present

## 2013-06-05 DIAGNOSIS — J45909 Unspecified asthma, uncomplicated: Secondary | ICD-10-CM | POA: Diagnosis not present

## 2013-06-08 DIAGNOSIS — G4710 Hypersomnia, unspecified: Secondary | ICD-10-CM | POA: Diagnosis not present

## 2013-06-08 DIAGNOSIS — R52 Pain, unspecified: Secondary | ICD-10-CM | POA: Diagnosis not present

## 2013-06-08 DIAGNOSIS — G4730 Sleep apnea, unspecified: Secondary | ICD-10-CM | POA: Diagnosis not present

## 2013-06-08 DIAGNOSIS — G20 Parkinson's disease: Secondary | ICD-10-CM | POA: Diagnosis not present

## 2013-06-08 DIAGNOSIS — J45909 Unspecified asthma, uncomplicated: Secondary | ICD-10-CM | POA: Diagnosis not present

## 2013-06-08 DIAGNOSIS — J029 Acute pharyngitis, unspecified: Secondary | ICD-10-CM | POA: Diagnosis not present

## 2013-06-08 DIAGNOSIS — I10 Essential (primary) hypertension: Secondary | ICD-10-CM | POA: Diagnosis not present

## 2013-06-08 DIAGNOSIS — F411 Generalized anxiety disorder: Secondary | ICD-10-CM | POA: Diagnosis not present

## 2013-06-11 DIAGNOSIS — F411 Generalized anxiety disorder: Secondary | ICD-10-CM | POA: Diagnosis not present

## 2013-06-11 DIAGNOSIS — G20 Parkinson's disease: Secondary | ICD-10-CM | POA: Diagnosis not present

## 2013-06-11 DIAGNOSIS — J45909 Unspecified asthma, uncomplicated: Secondary | ICD-10-CM | POA: Diagnosis not present

## 2013-06-11 DIAGNOSIS — I10 Essential (primary) hypertension: Secondary | ICD-10-CM | POA: Diagnosis not present

## 2013-06-11 DIAGNOSIS — G4730 Sleep apnea, unspecified: Secondary | ICD-10-CM | POA: Diagnosis not present

## 2013-06-11 DIAGNOSIS — G4710 Hypersomnia, unspecified: Secondary | ICD-10-CM | POA: Diagnosis not present

## 2013-06-11 DIAGNOSIS — R52 Pain, unspecified: Secondary | ICD-10-CM | POA: Diagnosis not present

## 2013-06-12 DIAGNOSIS — G4730 Sleep apnea, unspecified: Secondary | ICD-10-CM | POA: Diagnosis not present

## 2013-06-12 DIAGNOSIS — G20 Parkinson's disease: Secondary | ICD-10-CM | POA: Diagnosis not present

## 2013-06-12 DIAGNOSIS — G4710 Hypersomnia, unspecified: Secondary | ICD-10-CM | POA: Diagnosis not present

## 2013-06-12 DIAGNOSIS — F411 Generalized anxiety disorder: Secondary | ICD-10-CM | POA: Diagnosis not present

## 2013-06-12 DIAGNOSIS — I10 Essential (primary) hypertension: Secondary | ICD-10-CM | POA: Diagnosis not present

## 2013-06-12 DIAGNOSIS — R52 Pain, unspecified: Secondary | ICD-10-CM | POA: Diagnosis not present

## 2013-06-12 DIAGNOSIS — J45909 Unspecified asthma, uncomplicated: Secondary | ICD-10-CM | POA: Diagnosis not present

## 2013-06-15 DIAGNOSIS — N138 Other obstructive and reflux uropathy: Secondary | ICD-10-CM | POA: Diagnosis not present

## 2013-06-15 DIAGNOSIS — Z125 Encounter for screening for malignant neoplasm of prostate: Secondary | ICD-10-CM | POA: Diagnosis not present

## 2013-06-15 DIAGNOSIS — N401 Enlarged prostate with lower urinary tract symptoms: Secondary | ICD-10-CM | POA: Diagnosis not present

## 2013-06-15 DIAGNOSIS — N319 Neuromuscular dysfunction of bladder, unspecified: Secondary | ICD-10-CM | POA: Diagnosis not present

## 2013-06-15 DIAGNOSIS — N318 Other neuromuscular dysfunction of bladder: Secondary | ICD-10-CM | POA: Diagnosis not present

## 2013-06-16 DIAGNOSIS — G20 Parkinson's disease: Secondary | ICD-10-CM | POA: Diagnosis not present

## 2013-06-16 DIAGNOSIS — I10 Essential (primary) hypertension: Secondary | ICD-10-CM | POA: Diagnosis not present

## 2013-06-16 DIAGNOSIS — F411 Generalized anxiety disorder: Secondary | ICD-10-CM | POA: Diagnosis not present

## 2013-06-16 DIAGNOSIS — G4710 Hypersomnia, unspecified: Secondary | ICD-10-CM | POA: Diagnosis not present

## 2013-06-16 DIAGNOSIS — R52 Pain, unspecified: Secondary | ICD-10-CM | POA: Diagnosis not present

## 2013-06-16 DIAGNOSIS — J45909 Unspecified asthma, uncomplicated: Secondary | ICD-10-CM | POA: Diagnosis not present

## 2013-06-20 DIAGNOSIS — F411 Generalized anxiety disorder: Secondary | ICD-10-CM | POA: Diagnosis not present

## 2013-06-20 DIAGNOSIS — R52 Pain, unspecified: Secondary | ICD-10-CM | POA: Diagnosis not present

## 2013-06-20 DIAGNOSIS — G4710 Hypersomnia, unspecified: Secondary | ICD-10-CM | POA: Diagnosis not present

## 2013-06-20 DIAGNOSIS — J45909 Unspecified asthma, uncomplicated: Secondary | ICD-10-CM | POA: Diagnosis not present

## 2013-06-20 DIAGNOSIS — G20 Parkinson's disease: Secondary | ICD-10-CM | POA: Diagnosis not present

## 2013-06-20 DIAGNOSIS — I10 Essential (primary) hypertension: Secondary | ICD-10-CM | POA: Diagnosis not present

## 2013-06-22 DIAGNOSIS — G4730 Sleep apnea, unspecified: Secondary | ICD-10-CM | POA: Diagnosis not present

## 2013-06-22 DIAGNOSIS — F411 Generalized anxiety disorder: Secondary | ICD-10-CM | POA: Diagnosis not present

## 2013-06-22 DIAGNOSIS — G20 Parkinson's disease: Secondary | ICD-10-CM | POA: Diagnosis not present

## 2013-06-22 DIAGNOSIS — R52 Pain, unspecified: Secondary | ICD-10-CM | POA: Diagnosis not present

## 2013-06-22 DIAGNOSIS — J45909 Unspecified asthma, uncomplicated: Secondary | ICD-10-CM | POA: Diagnosis not present

## 2013-06-22 DIAGNOSIS — G4710 Hypersomnia, unspecified: Secondary | ICD-10-CM | POA: Diagnosis not present

## 2013-06-22 DIAGNOSIS — I10 Essential (primary) hypertension: Secondary | ICD-10-CM | POA: Diagnosis not present

## 2013-06-24 DIAGNOSIS — G4710 Hypersomnia, unspecified: Secondary | ICD-10-CM | POA: Diagnosis not present

## 2013-06-24 DIAGNOSIS — J45909 Unspecified asthma, uncomplicated: Secondary | ICD-10-CM | POA: Diagnosis not present

## 2013-06-24 DIAGNOSIS — F411 Generalized anxiety disorder: Secondary | ICD-10-CM | POA: Diagnosis not present

## 2013-06-24 DIAGNOSIS — I10 Essential (primary) hypertension: Secondary | ICD-10-CM | POA: Diagnosis not present

## 2013-06-24 DIAGNOSIS — G4730 Sleep apnea, unspecified: Secondary | ICD-10-CM | POA: Diagnosis not present

## 2013-06-24 DIAGNOSIS — R52 Pain, unspecified: Secondary | ICD-10-CM | POA: Diagnosis not present

## 2013-06-24 DIAGNOSIS — G20 Parkinson's disease: Secondary | ICD-10-CM | POA: Diagnosis not present

## 2013-06-29 DIAGNOSIS — F411 Generalized anxiety disorder: Secondary | ICD-10-CM | POA: Diagnosis not present

## 2013-06-29 DIAGNOSIS — G20 Parkinson's disease: Secondary | ICD-10-CM | POA: Diagnosis not present

## 2013-06-29 DIAGNOSIS — R52 Pain, unspecified: Secondary | ICD-10-CM | POA: Diagnosis not present

## 2013-06-29 DIAGNOSIS — G4710 Hypersomnia, unspecified: Secondary | ICD-10-CM | POA: Diagnosis not present

## 2013-06-29 DIAGNOSIS — J45909 Unspecified asthma, uncomplicated: Secondary | ICD-10-CM | POA: Diagnosis not present

## 2013-06-29 DIAGNOSIS — I10 Essential (primary) hypertension: Secondary | ICD-10-CM | POA: Diagnosis not present

## 2013-06-30 DIAGNOSIS — G20 Parkinson's disease: Secondary | ICD-10-CM | POA: Diagnosis not present

## 2013-06-30 DIAGNOSIS — G4730 Sleep apnea, unspecified: Secondary | ICD-10-CM | POA: Diagnosis not present

## 2013-06-30 DIAGNOSIS — J45909 Unspecified asthma, uncomplicated: Secondary | ICD-10-CM | POA: Diagnosis not present

## 2013-06-30 DIAGNOSIS — I10 Essential (primary) hypertension: Secondary | ICD-10-CM | POA: Diagnosis not present

## 2013-06-30 DIAGNOSIS — G4710 Hypersomnia, unspecified: Secondary | ICD-10-CM | POA: Diagnosis not present

## 2013-06-30 DIAGNOSIS — R52 Pain, unspecified: Secondary | ICD-10-CM | POA: Diagnosis not present

## 2013-06-30 DIAGNOSIS — F411 Generalized anxiety disorder: Secondary | ICD-10-CM | POA: Diagnosis not present

## 2013-07-03 DIAGNOSIS — I10 Essential (primary) hypertension: Secondary | ICD-10-CM | POA: Diagnosis not present

## 2013-07-03 DIAGNOSIS — G20 Parkinson's disease: Secondary | ICD-10-CM | POA: Diagnosis not present

## 2013-07-03 DIAGNOSIS — G4730 Sleep apnea, unspecified: Secondary | ICD-10-CM | POA: Diagnosis not present

## 2013-07-03 DIAGNOSIS — G4710 Hypersomnia, unspecified: Secondary | ICD-10-CM | POA: Diagnosis not present

## 2013-07-03 DIAGNOSIS — F411 Generalized anxiety disorder: Secondary | ICD-10-CM | POA: Diagnosis not present

## 2013-07-03 DIAGNOSIS — J45909 Unspecified asthma, uncomplicated: Secondary | ICD-10-CM | POA: Diagnosis not present

## 2013-07-03 DIAGNOSIS — R52 Pain, unspecified: Secondary | ICD-10-CM | POA: Diagnosis not present

## 2013-07-06 DIAGNOSIS — F411 Generalized anxiety disorder: Secondary | ICD-10-CM | POA: Diagnosis not present

## 2013-07-06 DIAGNOSIS — G8929 Other chronic pain: Secondary | ICD-10-CM | POA: Diagnosis not present

## 2013-07-07 DIAGNOSIS — G20 Parkinson's disease: Secondary | ICD-10-CM | POA: Diagnosis not present

## 2013-07-07 DIAGNOSIS — G4710 Hypersomnia, unspecified: Secondary | ICD-10-CM | POA: Diagnosis not present

## 2013-07-07 DIAGNOSIS — J45909 Unspecified asthma, uncomplicated: Secondary | ICD-10-CM | POA: Diagnosis not present

## 2013-07-07 DIAGNOSIS — R52 Pain, unspecified: Secondary | ICD-10-CM | POA: Diagnosis not present

## 2013-07-07 DIAGNOSIS — F411 Generalized anxiety disorder: Secondary | ICD-10-CM | POA: Diagnosis not present

## 2013-07-07 DIAGNOSIS — I10 Essential (primary) hypertension: Secondary | ICD-10-CM | POA: Diagnosis not present

## 2013-07-08 DIAGNOSIS — R52 Pain, unspecified: Secondary | ICD-10-CM | POA: Diagnosis not present

## 2013-07-08 DIAGNOSIS — G20 Parkinson's disease: Secondary | ICD-10-CM | POA: Diagnosis not present

## 2013-07-08 DIAGNOSIS — I10 Essential (primary) hypertension: Secondary | ICD-10-CM | POA: Diagnosis not present

## 2013-07-08 DIAGNOSIS — F411 Generalized anxiety disorder: Secondary | ICD-10-CM | POA: Diagnosis not present

## 2013-07-08 DIAGNOSIS — G4710 Hypersomnia, unspecified: Secondary | ICD-10-CM | POA: Diagnosis not present

## 2013-07-08 DIAGNOSIS — J45909 Unspecified asthma, uncomplicated: Secondary | ICD-10-CM | POA: Diagnosis not present

## 2013-07-13 DIAGNOSIS — R52 Pain, unspecified: Secondary | ICD-10-CM | POA: Diagnosis not present

## 2013-07-13 DIAGNOSIS — I10 Essential (primary) hypertension: Secondary | ICD-10-CM | POA: Diagnosis not present

## 2013-07-13 DIAGNOSIS — G4730 Sleep apnea, unspecified: Secondary | ICD-10-CM | POA: Diagnosis not present

## 2013-07-13 DIAGNOSIS — J45909 Unspecified asthma, uncomplicated: Secondary | ICD-10-CM | POA: Diagnosis not present

## 2013-07-13 DIAGNOSIS — G4710 Hypersomnia, unspecified: Secondary | ICD-10-CM | POA: Diagnosis not present

## 2013-07-13 DIAGNOSIS — G20 Parkinson's disease: Secondary | ICD-10-CM | POA: Diagnosis not present

## 2013-07-13 DIAGNOSIS — F411 Generalized anxiety disorder: Secondary | ICD-10-CM | POA: Diagnosis not present

## 2013-07-20 DIAGNOSIS — G4730 Sleep apnea, unspecified: Secondary | ICD-10-CM | POA: Diagnosis not present

## 2013-07-20 DIAGNOSIS — J45909 Unspecified asthma, uncomplicated: Secondary | ICD-10-CM | POA: Diagnosis not present

## 2013-07-20 DIAGNOSIS — I10 Essential (primary) hypertension: Secondary | ICD-10-CM | POA: Diagnosis not present

## 2013-07-20 DIAGNOSIS — R52 Pain, unspecified: Secondary | ICD-10-CM | POA: Diagnosis not present

## 2013-07-20 DIAGNOSIS — F411 Generalized anxiety disorder: Secondary | ICD-10-CM | POA: Diagnosis not present

## 2013-07-20 DIAGNOSIS — G20 Parkinson's disease: Secondary | ICD-10-CM | POA: Diagnosis not present

## 2013-07-20 DIAGNOSIS — G4710 Hypersomnia, unspecified: Secondary | ICD-10-CM | POA: Diagnosis not present

## 2013-07-25 IMAGING — RF DG MYELOGRAM 2+ REGIONS
14 of 24 series · 14 of 24 positions shown · IV contrast (omnipaque)
Comparison: none

CLINICAL DATA: The left shoulder pain.  Neck pain.  Low back pain.

MYELOGRAM CERVICAL AND LUMBAR
TECHNIQUE: The procedure, risks, benefits, and alternatives were
explained to the patient. The patient understands and consents.
Under fluoroscopic guidance, a 22 gauge spinal needle was placed in
the CSF space via left L3-4 approach. 10 mL of Omnipaque 300 was
injected.

[Series 1: (hospital) · 1 of 1 slices shown]
[im 1/1]
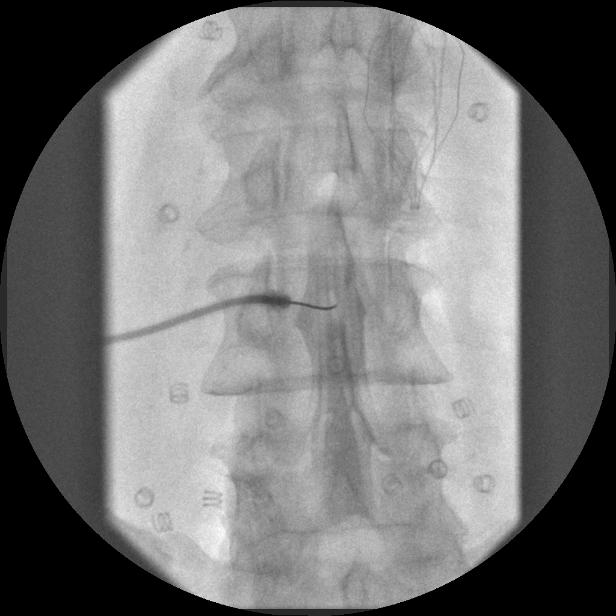

[Series 3: myelogram  white · 1 of 1 slices shown (1 of 13)]
[im 1/1]
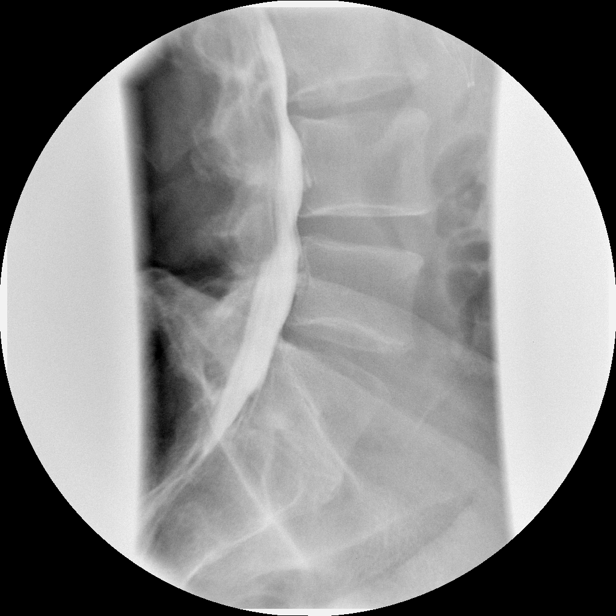

[Series 5: myelogram  white · 1 of 1 slices shown (2 of 13)]
[im 1/1]
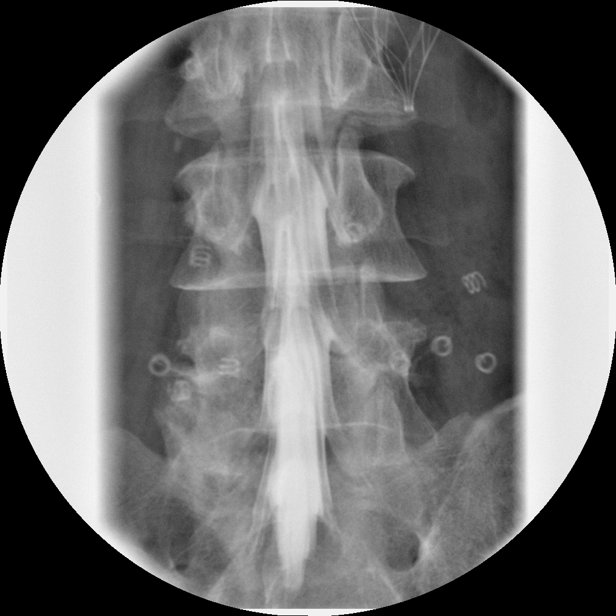

[Series 7: myelogram  white · 1 of 1 slices shown (3 of 13)]
[im 1/1]
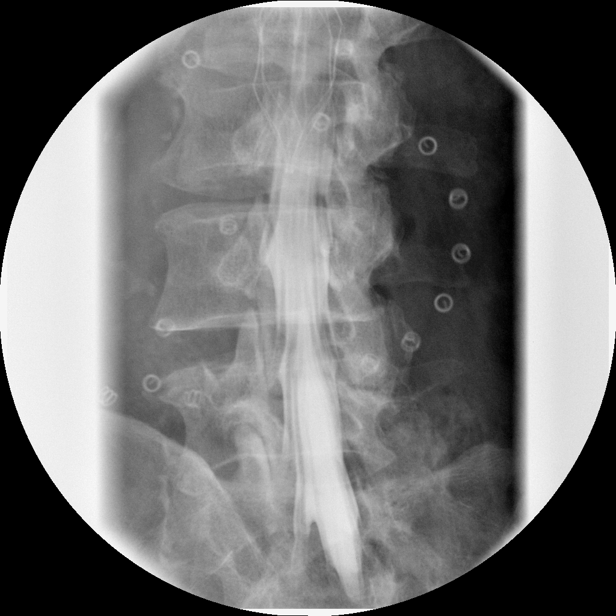

[Series 8: myelogram  white · 1 of 1 slices shown (4 of 13)]
[im 1/1]
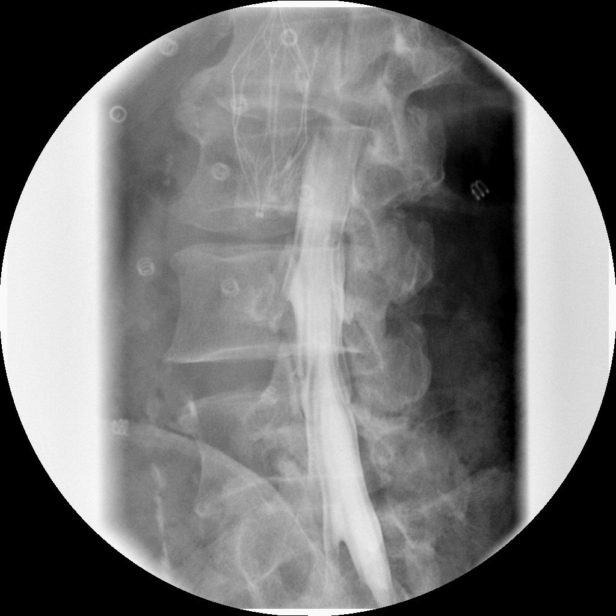

[Series 10: myelogram  white · 1 of 1 slices shown (5 of 13)]
[im 1/1]
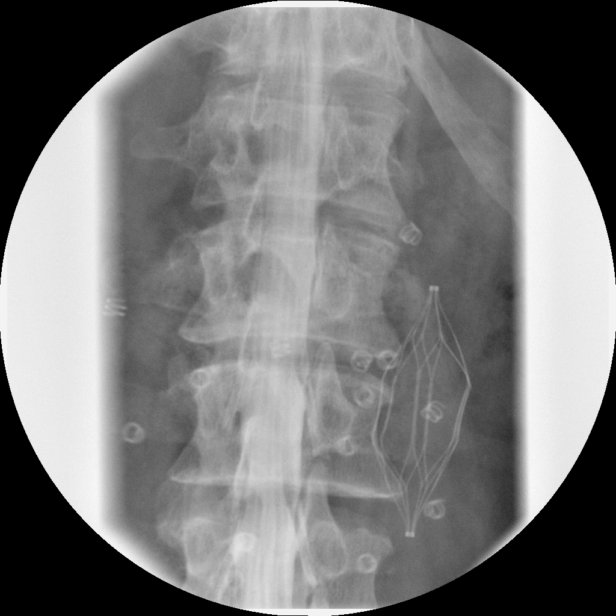

[Series 12: myelogram  white · 1 of 1 slices shown (6 of 13)]
[im 1/1]
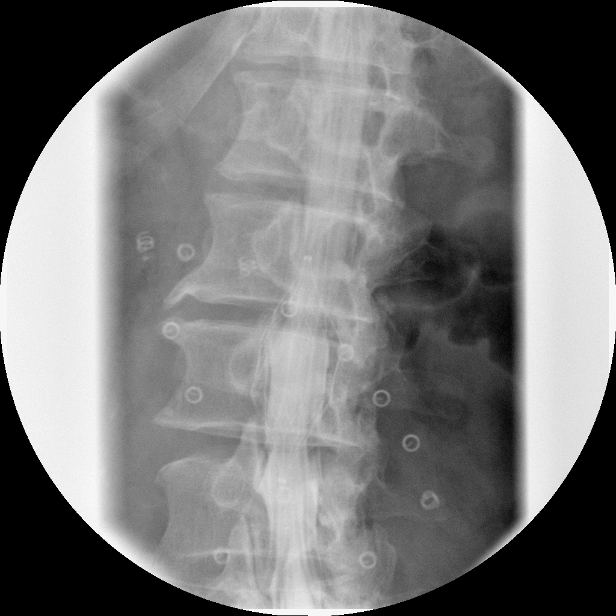

[Series 13: myelogram  white · 1 of 1 slices shown (7 of 13)]
[im 1/1]
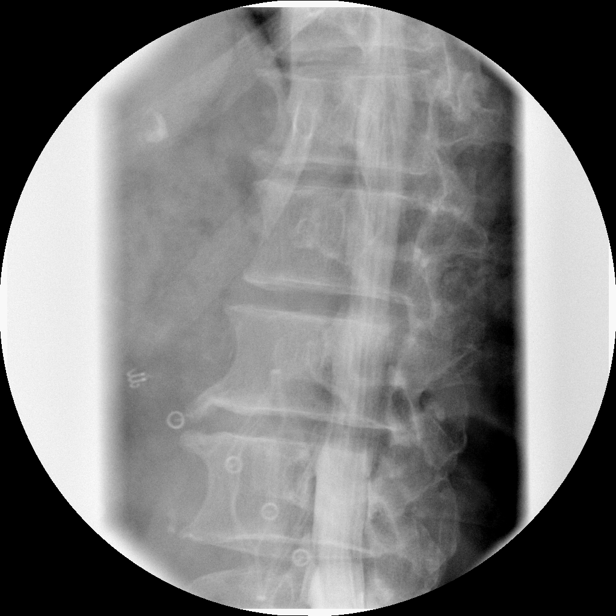

[Series 15: myelogram  white · 1 of 1 slices shown (8 of 13)]
[im 1/1]
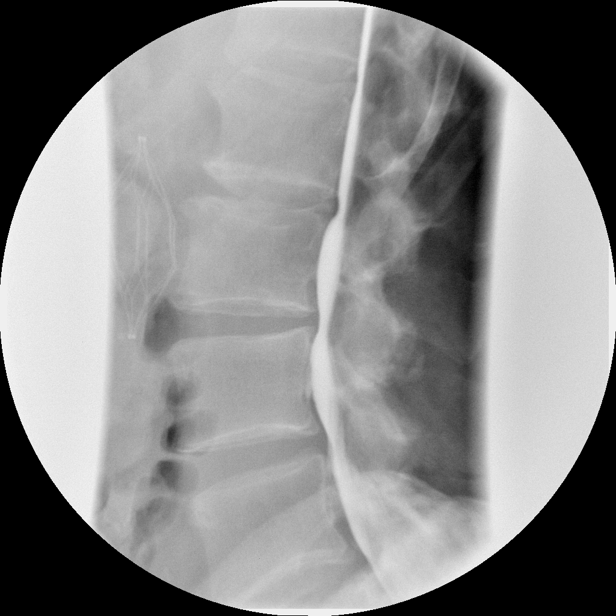

[Series 17: myelogram  white · 1 of 1 slices shown (9 of 13)]
[im 1/1]
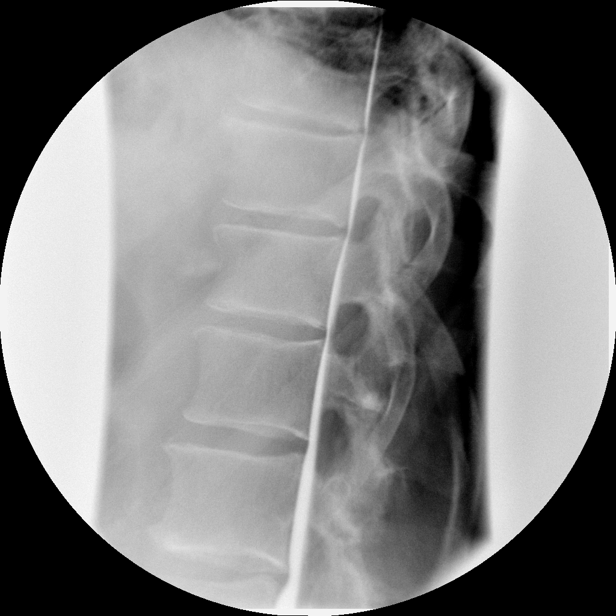

[Series 19: myelogram  white · 1 of 1 slices shown (10 of 13)]
[im 1/1]
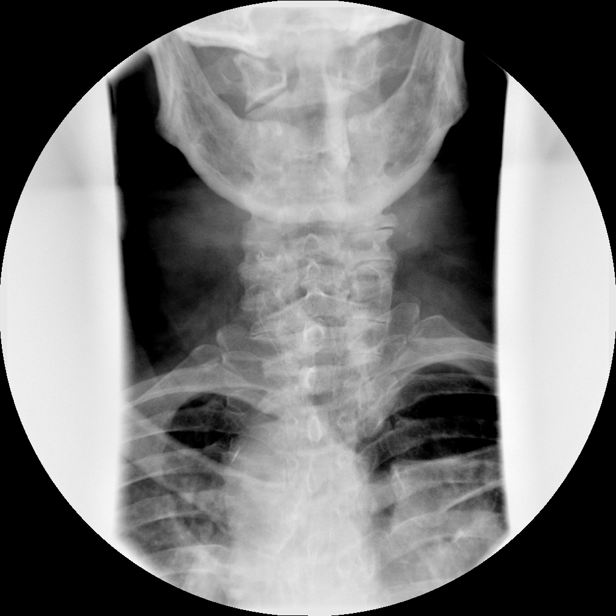

[Series 20: myelogram  white · 1 of 1 slices shown (11 of 13)]
[im 1/1]
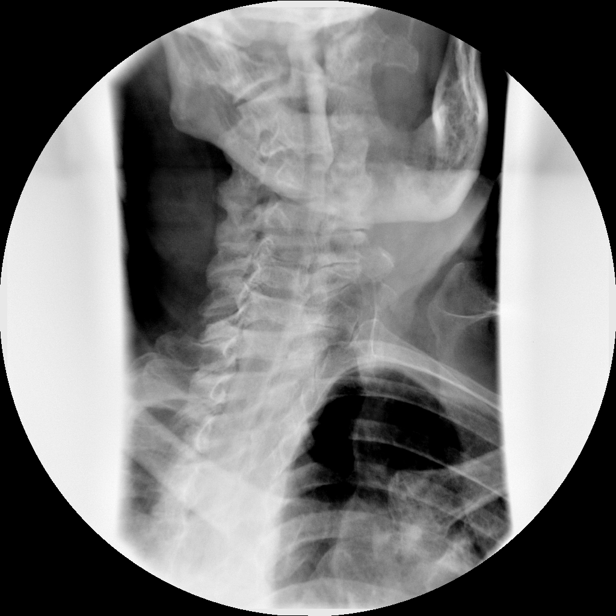

[Series 22: myelogram  white · 1 of 1 slices shown (12 of 13)]
[im 1/1]
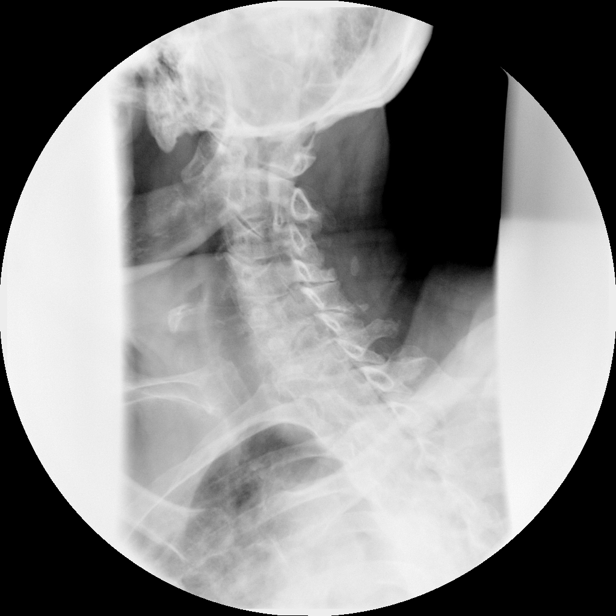

[Series 24: myelogram  white · 1 of 1 slices shown (13 of 13)]
[im 1/1]
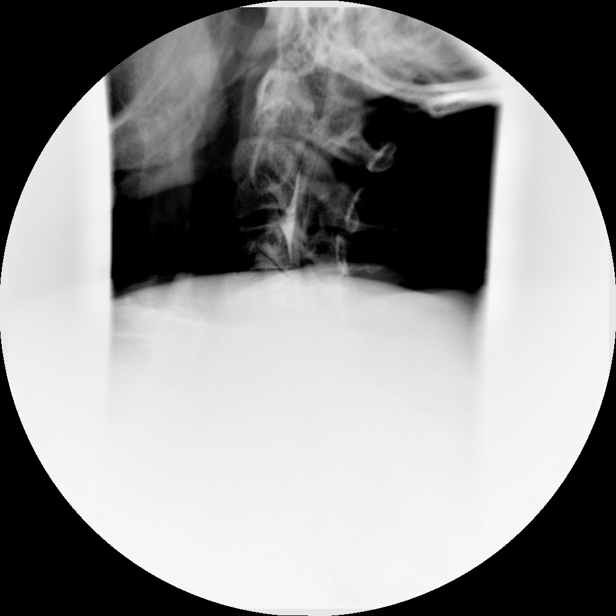

[14 of 24 positions shown; findings below may reference images not displayed]

FINDINGS: There is no obvious impingement in the lumbar spine.  No
central stenosis.  IVC filter is noted.  Mild anterior epidural
mass effect at L2-3 associated with disc osteophytes.  Anatomic
alignment.  No vertebral compression deformity.

Minimal contrast in the cervical spine.  No obvious spinal
stenosis.  Oblique views are limited.

Flexion and extension views demonstrate no evidence of instability.

Complications: None

Fluoroscopy Time: 2 minutes and 50 seconds.
IMPRESSION: No evidence of impingement.  Lumbar cervical myelogram in
preparation for MRI.

## 2013-07-27 DIAGNOSIS — N318 Other neuromuscular dysfunction of bladder: Secondary | ICD-10-CM | POA: Diagnosis not present

## 2013-07-27 DIAGNOSIS — R141 Gas pain: Secondary | ICD-10-CM | POA: Diagnosis not present

## 2013-07-27 DIAGNOSIS — R351 Nocturia: Secondary | ICD-10-CM | POA: Diagnosis not present

## 2013-07-27 DIAGNOSIS — R142 Eructation: Secondary | ICD-10-CM | POA: Diagnosis not present

## 2013-07-27 DIAGNOSIS — M549 Dorsalgia, unspecified: Secondary | ICD-10-CM | POA: Diagnosis not present

## 2013-08-05 DIAGNOSIS — I499 Cardiac arrhythmia, unspecified: Secondary | ICD-10-CM | POA: Diagnosis not present

## 2013-08-05 DIAGNOSIS — F411 Generalized anxiety disorder: Secondary | ICD-10-CM | POA: Diagnosis not present

## 2013-08-05 DIAGNOSIS — Z0389 Encounter for observation for other suspected diseases and conditions ruled out: Secondary | ICD-10-CM | POA: Diagnosis not present

## 2013-08-05 DIAGNOSIS — G8929 Other chronic pain: Secondary | ICD-10-CM | POA: Diagnosis not present

## 2013-08-13 DIAGNOSIS — Z86010 Personal history of colonic polyps: Secondary | ICD-10-CM | POA: Diagnosis not present

## 2013-08-13 DIAGNOSIS — D500 Iron deficiency anemia secondary to blood loss (chronic): Secondary | ICD-10-CM | POA: Diagnosis not present

## 2013-08-13 DIAGNOSIS — K219 Gastro-esophageal reflux disease without esophagitis: Secondary | ICD-10-CM | POA: Diagnosis not present

## 2013-08-13 DIAGNOSIS — Z800 Family history of malignant neoplasm of digestive organs: Secondary | ICD-10-CM | POA: Diagnosis not present

## 2013-09-04 DIAGNOSIS — F411 Generalized anxiety disorder: Secondary | ICD-10-CM | POA: Diagnosis not present

## 2013-09-04 DIAGNOSIS — G8929 Other chronic pain: Secondary | ICD-10-CM | POA: Diagnosis not present

## 2013-09-15 DIAGNOSIS — K5900 Constipation, unspecified: Secondary | ICD-10-CM | POA: Diagnosis not present

## 2013-09-30 DIAGNOSIS — Z800 Family history of malignant neoplasm of digestive organs: Secondary | ICD-10-CM | POA: Diagnosis not present

## 2013-09-30 DIAGNOSIS — K5900 Constipation, unspecified: Secondary | ICD-10-CM | POA: Diagnosis not present

## 2013-09-30 DIAGNOSIS — Z86010 Personal history of colonic polyps: Secondary | ICD-10-CM | POA: Diagnosis not present

## 2013-09-30 DIAGNOSIS — K219 Gastro-esophageal reflux disease without esophagitis: Secondary | ICD-10-CM | POA: Diagnosis not present

## 2013-10-02 DIAGNOSIS — G8929 Other chronic pain: Secondary | ICD-10-CM | POA: Diagnosis not present

## 2013-10-02 DIAGNOSIS — F411 Generalized anxiety disorder: Secondary | ICD-10-CM | POA: Diagnosis not present

## 2013-10-07 DIAGNOSIS — F419 Anxiety disorder, unspecified: Secondary | ICD-10-CM | POA: Insufficient documentation

## 2013-10-07 DIAGNOSIS — G20 Parkinson's disease: Secondary | ICD-10-CM | POA: Diagnosis not present

## 2013-10-07 DIAGNOSIS — F411 Generalized anxiety disorder: Secondary | ICD-10-CM | POA: Diagnosis not present

## 2013-10-07 DIAGNOSIS — F482 Pseudobulbar affect: Secondary | ICD-10-CM | POA: Insufficient documentation

## 2013-10-07 DIAGNOSIS — R0789 Other chest pain: Secondary | ICD-10-CM | POA: Insufficient documentation

## 2013-10-07 DIAGNOSIS — R071 Chest pain on breathing: Secondary | ICD-10-CM | POA: Diagnosis not present

## 2013-10-07 DIAGNOSIS — M542 Cervicalgia: Secondary | ICD-10-CM | POA: Insufficient documentation

## 2013-10-13 DIAGNOSIS — M47817 Spondylosis without myelopathy or radiculopathy, lumbosacral region: Secondary | ICD-10-CM | POA: Diagnosis not present

## 2013-10-13 DIAGNOSIS — M545 Low back pain, unspecified: Secondary | ICD-10-CM | POA: Diagnosis not present

## 2013-10-30 DIAGNOSIS — G8929 Other chronic pain: Secondary | ICD-10-CM | POA: Diagnosis not present

## 2013-10-30 DIAGNOSIS — F411 Generalized anxiety disorder: Secondary | ICD-10-CM | POA: Diagnosis not present

## 2013-11-03 DIAGNOSIS — H01029 Squamous blepharitis unspecified eye, unspecified eyelid: Secondary | ICD-10-CM | POA: Diagnosis not present

## 2013-11-03 DIAGNOSIS — H35379 Puckering of macula, unspecified eye: Secondary | ICD-10-CM | POA: Diagnosis not present

## 2013-11-03 DIAGNOSIS — H43819 Vitreous degeneration, unspecified eye: Secondary | ICD-10-CM | POA: Diagnosis not present

## 2013-11-03 DIAGNOSIS — H04129 Dry eye syndrome of unspecified lacrimal gland: Secondary | ICD-10-CM | POA: Diagnosis not present

## 2013-11-03 DIAGNOSIS — H2510 Age-related nuclear cataract, unspecified eye: Secondary | ICD-10-CM | POA: Diagnosis not present

## 2013-11-11 DIAGNOSIS — M47812 Spondylosis without myelopathy or radiculopathy, cervical region: Secondary | ICD-10-CM | POA: Diagnosis not present

## 2013-11-11 DIAGNOSIS — M48061 Spinal stenosis, lumbar region without neurogenic claudication: Secondary | ICD-10-CM | POA: Diagnosis not present

## 2013-11-19 DIAGNOSIS — H2510 Age-related nuclear cataract, unspecified eye: Secondary | ICD-10-CM | POA: Diagnosis not present

## 2013-11-26 DIAGNOSIS — R0789 Other chest pain: Secondary | ICD-10-CM | POA: Diagnosis not present

## 2013-11-26 DIAGNOSIS — G8929 Other chronic pain: Secondary | ICD-10-CM | POA: Diagnosis not present

## 2013-11-26 DIAGNOSIS — F411 Generalized anxiety disorder: Secondary | ICD-10-CM | POA: Diagnosis not present

## 2013-11-26 DIAGNOSIS — Z9181 History of falling: Secondary | ICD-10-CM | POA: Diagnosis not present

## 2013-11-30 DIAGNOSIS — R062 Wheezing: Secondary | ICD-10-CM | POA: Diagnosis not present

## 2013-11-30 DIAGNOSIS — R0602 Shortness of breath: Secondary | ICD-10-CM | POA: Diagnosis not present

## 2013-11-30 DIAGNOSIS — Z87891 Personal history of nicotine dependence: Secondary | ICD-10-CM | POA: Diagnosis not present

## 2013-12-01 DIAGNOSIS — E785 Hyperlipidemia, unspecified: Secondary | ICD-10-CM | POA: Diagnosis not present

## 2013-12-01 DIAGNOSIS — I10 Essential (primary) hypertension: Secondary | ICD-10-CM | POA: Diagnosis not present

## 2013-12-01 DIAGNOSIS — R079 Chest pain, unspecified: Secondary | ICD-10-CM | POA: Diagnosis not present

## 2013-12-01 DIAGNOSIS — R0602 Shortness of breath: Secondary | ICD-10-CM | POA: Diagnosis not present

## 2013-12-01 DIAGNOSIS — I252 Old myocardial infarction: Secondary | ICD-10-CM | POA: Diagnosis not present

## 2013-12-01 DIAGNOSIS — I2782 Chronic pulmonary embolism: Secondary | ICD-10-CM | POA: Diagnosis not present

## 2013-12-01 DIAGNOSIS — I2510 Atherosclerotic heart disease of native coronary artery without angina pectoris: Secondary | ICD-10-CM | POA: Diagnosis not present

## 2013-12-01 DIAGNOSIS — G20 Parkinson's disease: Secondary | ICD-10-CM | POA: Diagnosis not present

## 2013-12-03 DIAGNOSIS — R0602 Shortness of breath: Secondary | ICD-10-CM | POA: Diagnosis not present

## 2013-12-18 DIAGNOSIS — M47812 Spondylosis without myelopathy or radiculopathy, cervical region: Secondary | ICD-10-CM | POA: Diagnosis not present

## 2013-12-18 DIAGNOSIS — M545 Low back pain: Secondary | ICD-10-CM | POA: Diagnosis not present

## 2013-12-18 DIAGNOSIS — M47816 Spondylosis without myelopathy or radiculopathy, lumbar region: Secondary | ICD-10-CM | POA: Diagnosis not present

## 2013-12-21 DIAGNOSIS — G8929 Other chronic pain: Secondary | ICD-10-CM | POA: Diagnosis not present

## 2013-12-21 DIAGNOSIS — Z23 Encounter for immunization: Secondary | ICD-10-CM | POA: Diagnosis not present

## 2013-12-21 DIAGNOSIS — F419 Anxiety disorder, unspecified: Secondary | ICD-10-CM | POA: Diagnosis not present

## 2013-12-24 DIAGNOSIS — G4733 Obstructive sleep apnea (adult) (pediatric): Secondary | ICD-10-CM | POA: Diagnosis not present

## 2013-12-24 DIAGNOSIS — R5383 Other fatigue: Secondary | ICD-10-CM | POA: Diagnosis not present

## 2013-12-24 DIAGNOSIS — J4520 Mild intermittent asthma, uncomplicated: Secondary | ICD-10-CM | POA: Diagnosis not present

## 2013-12-28 DIAGNOSIS — J0100 Acute maxillary sinusitis, unspecified: Secondary | ICD-10-CM | POA: Diagnosis not present

## 2014-01-04 DIAGNOSIS — J0100 Acute maxillary sinusitis, unspecified: Secondary | ICD-10-CM | POA: Diagnosis not present

## 2014-01-11 DIAGNOSIS — I2510 Atherosclerotic heart disease of native coronary artery without angina pectoris: Secondary | ICD-10-CM | POA: Diagnosis not present

## 2014-01-11 DIAGNOSIS — I10 Essential (primary) hypertension: Secondary | ICD-10-CM | POA: Diagnosis not present

## 2014-01-11 DIAGNOSIS — G20 Parkinson's disease: Secondary | ICD-10-CM | POA: Diagnosis not present

## 2014-01-11 DIAGNOSIS — E785 Hyperlipidemia, unspecified: Secondary | ICD-10-CM | POA: Diagnosis not present

## 2014-01-11 DIAGNOSIS — R0602 Shortness of breath: Secondary | ICD-10-CM | POA: Diagnosis not present

## 2014-01-13 DIAGNOSIS — G4733 Obstructive sleep apnea (adult) (pediatric): Secondary | ICD-10-CM | POA: Diagnosis not present

## 2014-01-13 DIAGNOSIS — J4520 Mild intermittent asthma, uncomplicated: Secondary | ICD-10-CM | POA: Diagnosis not present

## 2014-01-13 DIAGNOSIS — R5383 Other fatigue: Secondary | ICD-10-CM | POA: Diagnosis not present

## 2014-01-25 DIAGNOSIS — F419 Anxiety disorder, unspecified: Secondary | ICD-10-CM | POA: Diagnosis not present

## 2014-01-25 DIAGNOSIS — G8929 Other chronic pain: Secondary | ICD-10-CM | POA: Diagnosis not present

## 2014-01-30 DIAGNOSIS — G4733 Obstructive sleep apnea (adult) (pediatric): Secondary | ICD-10-CM | POA: Diagnosis not present

## 2014-02-09 DIAGNOSIS — R5383 Other fatigue: Secondary | ICD-10-CM | POA: Diagnosis not present

## 2014-02-09 DIAGNOSIS — J4520 Mild intermittent asthma, uncomplicated: Secondary | ICD-10-CM | POA: Diagnosis not present

## 2014-02-09 DIAGNOSIS — G4739 Other sleep apnea: Secondary | ICD-10-CM | POA: Diagnosis not present

## 2014-02-12 DIAGNOSIS — G4733 Obstructive sleep apnea (adult) (pediatric): Secondary | ICD-10-CM | POA: Diagnosis not present

## 2014-02-16 DIAGNOSIS — I2510 Atherosclerotic heart disease of native coronary artery without angina pectoris: Secondary | ICD-10-CM | POA: Diagnosis not present

## 2014-02-16 DIAGNOSIS — I252 Old myocardial infarction: Secondary | ICD-10-CM | POA: Diagnosis not present

## 2014-02-16 DIAGNOSIS — I10 Essential (primary) hypertension: Secondary | ICD-10-CM | POA: Diagnosis not present

## 2014-02-16 DIAGNOSIS — E785 Hyperlipidemia, unspecified: Secondary | ICD-10-CM | POA: Diagnosis not present

## 2014-02-16 DIAGNOSIS — G20 Parkinson's disease: Secondary | ICD-10-CM | POA: Diagnosis not present

## 2014-02-19 DIAGNOSIS — M545 Low back pain: Secondary | ICD-10-CM | POA: Diagnosis not present

## 2014-02-19 DIAGNOSIS — G8929 Other chronic pain: Secondary | ICD-10-CM | POA: Diagnosis not present

## 2014-02-22 DIAGNOSIS — Z79899 Other long term (current) drug therapy: Secondary | ICD-10-CM | POA: Diagnosis not present

## 2014-02-22 DIAGNOSIS — I10 Essential (primary) hypertension: Secondary | ICD-10-CM | POA: Diagnosis not present

## 2014-02-22 DIAGNOSIS — G8929 Other chronic pain: Secondary | ICD-10-CM | POA: Diagnosis not present

## 2014-02-22 DIAGNOSIS — F419 Anxiety disorder, unspecified: Secondary | ICD-10-CM | POA: Diagnosis not present

## 2014-02-22 DIAGNOSIS — M545 Low back pain: Secondary | ICD-10-CM | POA: Diagnosis not present

## 2014-02-24 DIAGNOSIS — F482 Pseudobulbar affect: Secondary | ICD-10-CM | POA: Diagnosis not present

## 2014-02-24 DIAGNOSIS — G20 Parkinson's disease: Secondary | ICD-10-CM | POA: Diagnosis not present

## 2014-02-24 DIAGNOSIS — M542 Cervicalgia: Secondary | ICD-10-CM | POA: Diagnosis not present

## 2014-02-24 DIAGNOSIS — R269 Unspecified abnormalities of gait and mobility: Secondary | ICD-10-CM | POA: Insufficient documentation

## 2014-02-24 DIAGNOSIS — F419 Anxiety disorder, unspecified: Secondary | ICD-10-CM | POA: Diagnosis not present

## 2014-02-26 DIAGNOSIS — M545 Low back pain: Secondary | ICD-10-CM | POA: Diagnosis not present

## 2014-02-26 DIAGNOSIS — G8929 Other chronic pain: Secondary | ICD-10-CM | POA: Diagnosis not present

## 2014-03-01 DIAGNOSIS — M545 Low back pain: Secondary | ICD-10-CM | POA: Diagnosis not present

## 2014-03-01 DIAGNOSIS — G8929 Other chronic pain: Secondary | ICD-10-CM | POA: Diagnosis not present

## 2014-03-04 DIAGNOSIS — G8929 Other chronic pain: Secondary | ICD-10-CM | POA: Diagnosis not present

## 2014-03-04 DIAGNOSIS — M545 Low back pain: Secondary | ICD-10-CM | POA: Diagnosis not present

## 2014-03-09 DIAGNOSIS — G8929 Other chronic pain: Secondary | ICD-10-CM | POA: Diagnosis not present

## 2014-03-09 DIAGNOSIS — M545 Low back pain: Secondary | ICD-10-CM | POA: Diagnosis not present

## 2014-03-11 DIAGNOSIS — M545 Low back pain: Secondary | ICD-10-CM | POA: Diagnosis not present

## 2014-03-11 DIAGNOSIS — G8929 Other chronic pain: Secondary | ICD-10-CM | POA: Diagnosis not present

## 2014-03-16 DIAGNOSIS — G8929 Other chronic pain: Secondary | ICD-10-CM | POA: Diagnosis not present

## 2014-03-16 DIAGNOSIS — M545 Low back pain: Secondary | ICD-10-CM | POA: Diagnosis not present

## 2014-03-17 DIAGNOSIS — Z961 Presence of intraocular lens: Secondary | ICD-10-CM | POA: Diagnosis not present

## 2014-03-17 DIAGNOSIS — H35372 Puckering of macula, left eye: Secondary | ICD-10-CM | POA: Diagnosis not present

## 2014-03-17 DIAGNOSIS — H04123 Dry eye syndrome of bilateral lacrimal glands: Secondary | ICD-10-CM | POA: Diagnosis not present

## 2014-03-17 DIAGNOSIS — H2511 Age-related nuclear cataract, right eye: Secondary | ICD-10-CM | POA: Diagnosis not present

## 2014-03-18 DIAGNOSIS — R5383 Other fatigue: Secondary | ICD-10-CM | POA: Diagnosis not present

## 2014-03-18 DIAGNOSIS — J4520 Mild intermittent asthma, uncomplicated: Secondary | ICD-10-CM | POA: Diagnosis not present

## 2014-03-18 DIAGNOSIS — G8929 Other chronic pain: Secondary | ICD-10-CM | POA: Diagnosis not present

## 2014-03-18 DIAGNOSIS — M545 Low back pain: Secondary | ICD-10-CM | POA: Diagnosis not present

## 2014-03-18 DIAGNOSIS — G4739 Other sleep apnea: Secondary | ICD-10-CM | POA: Diagnosis not present

## 2014-03-19 DIAGNOSIS — M545 Low back pain: Secondary | ICD-10-CM | POA: Diagnosis not present

## 2014-03-19 DIAGNOSIS — G8929 Other chronic pain: Secondary | ICD-10-CM | POA: Diagnosis not present

## 2014-03-19 DIAGNOSIS — H2512 Age-related nuclear cataract, left eye: Secondary | ICD-10-CM | POA: Diagnosis not present

## 2014-03-22 DIAGNOSIS — G8929 Other chronic pain: Secondary | ICD-10-CM | POA: Diagnosis not present

## 2014-03-22 DIAGNOSIS — F419 Anxiety disorder, unspecified: Secondary | ICD-10-CM | POA: Diagnosis not present

## 2014-03-23 DIAGNOSIS — M545 Low back pain: Secondary | ICD-10-CM | POA: Diagnosis not present

## 2014-03-23 DIAGNOSIS — G8929 Other chronic pain: Secondary | ICD-10-CM | POA: Diagnosis not present

## 2014-03-24 DIAGNOSIS — G8929 Other chronic pain: Secondary | ICD-10-CM | POA: Diagnosis not present

## 2014-03-24 DIAGNOSIS — M545 Low back pain: Secondary | ICD-10-CM | POA: Diagnosis not present

## 2014-03-25 DIAGNOSIS — H2512 Age-related nuclear cataract, left eye: Secondary | ICD-10-CM | POA: Diagnosis not present

## 2014-04-01 DIAGNOSIS — G8929 Other chronic pain: Secondary | ICD-10-CM | POA: Diagnosis not present

## 2014-04-01 DIAGNOSIS — M545 Low back pain: Secondary | ICD-10-CM | POA: Diagnosis not present

## 2014-04-08 DIAGNOSIS — G8929 Other chronic pain: Secondary | ICD-10-CM | POA: Diagnosis not present

## 2014-04-08 DIAGNOSIS — M545 Low back pain: Secondary | ICD-10-CM | POA: Diagnosis not present

## 2014-04-22 DIAGNOSIS — Z7409 Other reduced mobility: Secondary | ICD-10-CM | POA: Diagnosis not present

## 2014-04-22 DIAGNOSIS — F419 Anxiety disorder, unspecified: Secondary | ICD-10-CM | POA: Diagnosis not present

## 2014-04-22 DIAGNOSIS — G8929 Other chronic pain: Secondary | ICD-10-CM | POA: Diagnosis not present

## 2014-04-22 DIAGNOSIS — Z6830 Body mass index (BMI) 30.0-30.9, adult: Secondary | ICD-10-CM | POA: Diagnosis not present

## 2014-04-22 DIAGNOSIS — I10 Essential (primary) hypertension: Secondary | ICD-10-CM | POA: Diagnosis not present

## 2014-04-27 DIAGNOSIS — J069 Acute upper respiratory infection, unspecified: Secondary | ICD-10-CM | POA: Diagnosis not present

## 2014-04-28 DIAGNOSIS — Z961 Presence of intraocular lens: Secondary | ICD-10-CM | POA: Diagnosis not present

## 2014-04-28 DIAGNOSIS — H16223 Keratoconjunctivitis sicca, not specified as Sjogren's, bilateral: Secondary | ICD-10-CM | POA: Diagnosis not present

## 2014-05-03 DIAGNOSIS — J4530 Mild persistent asthma, uncomplicated: Secondary | ICD-10-CM | POA: Diagnosis not present

## 2014-05-03 DIAGNOSIS — J310 Chronic rhinitis: Secondary | ICD-10-CM | POA: Diagnosis not present

## 2014-05-03 DIAGNOSIS — J985 Diseases of mediastinum, not elsewhere classified: Secondary | ICD-10-CM | POA: Diagnosis not present

## 2014-05-03 DIAGNOSIS — R5383 Other fatigue: Secondary | ICD-10-CM | POA: Diagnosis not present

## 2014-05-13 DIAGNOSIS — I517 Cardiomegaly: Secondary | ICD-10-CM | POA: Diagnosis not present

## 2014-05-13 DIAGNOSIS — I2510 Atherosclerotic heart disease of native coronary artery without angina pectoris: Secondary | ICD-10-CM | POA: Diagnosis not present

## 2014-05-13 DIAGNOSIS — K449 Diaphragmatic hernia without obstruction or gangrene: Secondary | ICD-10-CM | POA: Diagnosis not present

## 2014-05-13 DIAGNOSIS — R591 Generalized enlarged lymph nodes: Secondary | ICD-10-CM | POA: Diagnosis not present

## 2014-05-13 DIAGNOSIS — J985 Diseases of mediastinum, not elsewhere classified: Secondary | ICD-10-CM | POA: Diagnosis not present

## 2014-05-13 DIAGNOSIS — N281 Cyst of kidney, acquired: Secondary | ICD-10-CM | POA: Diagnosis not present

## 2014-05-19 DIAGNOSIS — G8929 Other chronic pain: Secondary | ICD-10-CM | POA: Diagnosis not present

## 2014-05-19 DIAGNOSIS — Z6830 Body mass index (BMI) 30.0-30.9, adult: Secondary | ICD-10-CM | POA: Diagnosis not present

## 2014-05-19 DIAGNOSIS — N189 Chronic kidney disease, unspecified: Secondary | ICD-10-CM | POA: Diagnosis not present

## 2014-05-19 DIAGNOSIS — F419 Anxiety disorder, unspecified: Secondary | ICD-10-CM | POA: Diagnosis not present

## 2014-05-20 DIAGNOSIS — N319 Neuromuscular dysfunction of bladder, unspecified: Secondary | ICD-10-CM | POA: Diagnosis not present

## 2014-05-20 DIAGNOSIS — N3281 Overactive bladder: Secondary | ICD-10-CM | POA: Diagnosis not present

## 2014-05-20 DIAGNOSIS — N401 Enlarged prostate with lower urinary tract symptoms: Secondary | ICD-10-CM | POA: Diagnosis not present

## 2014-06-03 DIAGNOSIS — F419 Anxiety disorder, unspecified: Secondary | ICD-10-CM | POA: Diagnosis not present

## 2014-06-03 DIAGNOSIS — G20 Parkinson's disease: Secondary | ICD-10-CM | POA: Diagnosis not present

## 2014-06-03 DIAGNOSIS — R413 Other amnesia: Secondary | ICD-10-CM | POA: Diagnosis not present

## 2014-06-03 DIAGNOSIS — F482 Pseudobulbar affect: Secondary | ICD-10-CM | POA: Diagnosis not present

## 2014-06-03 DIAGNOSIS — M542 Cervicalgia: Secondary | ICD-10-CM | POA: Diagnosis not present

## 2014-06-10 DIAGNOSIS — R413 Other amnesia: Secondary | ICD-10-CM | POA: Diagnosis not present

## 2014-06-18 DIAGNOSIS — G20 Parkinson's disease: Secondary | ICD-10-CM | POA: Diagnosis not present

## 2014-06-18 DIAGNOSIS — G8929 Other chronic pain: Secondary | ICD-10-CM | POA: Diagnosis not present

## 2014-06-18 DIAGNOSIS — F419 Anxiety disorder, unspecified: Secondary | ICD-10-CM | POA: Diagnosis not present

## 2014-06-18 DIAGNOSIS — Z6830 Body mass index (BMI) 30.0-30.9, adult: Secondary | ICD-10-CM | POA: Diagnosis not present

## 2014-07-01 DIAGNOSIS — F482 Pseudobulbar affect: Secondary | ICD-10-CM | POA: Diagnosis not present

## 2014-07-01 DIAGNOSIS — R269 Unspecified abnormalities of gait and mobility: Secondary | ICD-10-CM | POA: Diagnosis not present

## 2014-07-01 DIAGNOSIS — R413 Other amnesia: Secondary | ICD-10-CM | POA: Diagnosis not present

## 2014-07-01 DIAGNOSIS — G20 Parkinson's disease: Secondary | ICD-10-CM | POA: Diagnosis not present

## 2014-07-05 DIAGNOSIS — R413 Other amnesia: Secondary | ICD-10-CM | POA: Diagnosis not present

## 2014-07-05 DIAGNOSIS — R269 Unspecified abnormalities of gait and mobility: Secondary | ICD-10-CM | POA: Diagnosis not present

## 2014-07-15 DIAGNOSIS — G8929 Other chronic pain: Secondary | ICD-10-CM | POA: Diagnosis not present

## 2014-07-15 DIAGNOSIS — Z6830 Body mass index (BMI) 30.0-30.9, adult: Secondary | ICD-10-CM | POA: Diagnosis not present

## 2014-07-15 DIAGNOSIS — F419 Anxiety disorder, unspecified: Secondary | ICD-10-CM | POA: Diagnosis not present

## 2014-07-15 DIAGNOSIS — G20 Parkinson's disease: Secondary | ICD-10-CM | POA: Diagnosis not present

## 2014-07-15 DIAGNOSIS — Z1389 Encounter for screening for other disorder: Secondary | ICD-10-CM | POA: Diagnosis not present

## 2014-07-15 DIAGNOSIS — Z9181 History of falling: Secondary | ICD-10-CM | POA: Diagnosis not present

## 2014-07-27 ENCOUNTER — Encounter: Payer: Self-pay | Admitting: Surgery

## 2014-07-27 ENCOUNTER — Other Ambulatory Visit: Payer: Self-pay | Admitting: *Deleted

## 2014-07-27 DIAGNOSIS — I83893 Varicose veins of bilateral lower extremities with other complications: Secondary | ICD-10-CM

## 2014-08-12 DIAGNOSIS — Z961 Presence of intraocular lens: Secondary | ICD-10-CM | POA: Diagnosis not present

## 2014-08-12 DIAGNOSIS — H16223 Keratoconjunctivitis sicca, not specified as Sjogren's, bilateral: Secondary | ICD-10-CM | POA: Diagnosis not present

## 2014-08-12 DIAGNOSIS — H43813 Vitreous degeneration, bilateral: Secondary | ICD-10-CM | POA: Diagnosis not present

## 2014-08-16 DIAGNOSIS — Z1389 Encounter for screening for other disorder: Secondary | ICD-10-CM | POA: Diagnosis not present

## 2014-08-16 DIAGNOSIS — F419 Anxiety disorder, unspecified: Secondary | ICD-10-CM | POA: Diagnosis not present

## 2014-08-16 DIAGNOSIS — Z6830 Body mass index (BMI) 30.0-30.9, adult: Secondary | ICD-10-CM | POA: Diagnosis not present

## 2014-08-16 DIAGNOSIS — G8929 Other chronic pain: Secondary | ICD-10-CM | POA: Diagnosis not present

## 2014-08-16 DIAGNOSIS — I10 Essential (primary) hypertension: Secondary | ICD-10-CM | POA: Diagnosis not present

## 2014-09-01 ENCOUNTER — Encounter: Payer: Self-pay | Admitting: Vascular Surgery

## 2014-09-01 DIAGNOSIS — F482 Pseudobulbar affect: Secondary | ICD-10-CM | POA: Diagnosis not present

## 2014-09-01 DIAGNOSIS — R269 Unspecified abnormalities of gait and mobility: Secondary | ICD-10-CM | POA: Diagnosis not present

## 2014-09-01 DIAGNOSIS — G20 Parkinson's disease: Secondary | ICD-10-CM | POA: Diagnosis not present

## 2014-09-01 DIAGNOSIS — R413 Other amnesia: Secondary | ICD-10-CM | POA: Diagnosis not present

## 2014-09-06 ENCOUNTER — Ambulatory Visit (INDEPENDENT_AMBULATORY_CARE_PROVIDER_SITE_OTHER): Payer: Medicare Other | Admitting: Vascular Surgery

## 2014-09-06 ENCOUNTER — Ambulatory Visit (HOSPITAL_COMMUNITY)
Admission: RE | Admit: 2014-09-06 | Discharge: 2014-09-06 | Disposition: A | Discharge status: Home / Self Care | Payer: Medicare Other | Source: Ambulatory Visit | Attending: Vascular Surgery | Admitting: Vascular Surgery

## 2014-09-06 ENCOUNTER — Encounter: Payer: Self-pay | Admitting: Vascular Surgery

## 2014-09-06 VITALS — BP 123/78 | HR 73 | Resp 16 | Ht 70.0 in | Wt 199.0 lb

## 2014-09-06 DIAGNOSIS — I83893 Varicose veins of bilateral lower extremities with other complications: Secondary | ICD-10-CM

## 2014-09-06 NOTE — Progress Notes (Signed)
Subjective:     Patient ID: Riley Ford, male   DOB: [birth year], 80 y.o.   MRN: [id number]  HPI this 80 year old male was referred for evaluation of varicose veins in both lower extremities and bilateral edema. Patient has a remote history of bilateral DVT in the 1990s. He suffered a pulmonary embolus. He had a IVC filter inserted into thousand and 9. He is currently on Xarelto and aspirin. He does develop swelling as the day progresses. He has a few small varicose veins and his daughter was concerned about these. He was wearing a long-leg elastic compression stockings for many years but recently discontinued those but now has resumed wearing the stockings. He has no recent history of DVT other than the remote history noted above. He denies any history of stasis ulcers or bleeding.  Past Medical History  Diagnosis Date  . Chronic kidney disease   . Parkinsons disease   . Impaired mobility   . Risk for falls   . DVT (deep venous thrombosis)   . Vitamin B 12 deficiency   . Chronic pain   . Anxiety   . Hypertension   . Systemic lupus erythematosus   . Varicose veins     History  Substance Use Topics  . Smoking status: Former Smoker -- 25 years    Types: Cigarettes    Quit date: 07/27/1986  . Smokeless tobacco: Former Systems developer    Quit date: 08/15/1973  . Alcohol Use: No    Family History  Problem Relation Age of Onset  . Hypertension Mother   . Heart disease Mother   . Cancer Father   . Heart disease Father   . Hypertension Father   . Diabetes Sister   . Hypertension Sister     Allergies  Allergen Reactions  . Codeine Hives    Pt states he can tolerate codeine if he takes Benadryl with it.  jkl     Current outpatient prescriptions:  .  Albuterol Sulfate 108 (90 BASE) MCG/ACT AEPB, Inhale into the lungs as directed., Disp: , Rfl:  .  ALFUZOSIN HCL ER PO, Take 10 mg by mouth daily., Disp: , Rfl:  .  aspirin 81 MG chewable tablet, Chew 81 mg by mouth daily., Disp: , Rfl:   .  atorvastatin (LIPITOR) 40 MG tablet, Take 40 mg by mouth daily., Disp: , Rfl:  .  Azelastine-Fluticasone 137-50 MCG/ACT SUSP, Place into the nose as directed., Disp: , Rfl:  .  BENAZEPRIL HCL PO, Take 5 mg by mouth daily., Disp: , Rfl:  .  carbidopa-levodopa (SINEMET IR) 25-250 MG per tablet, Take 1 tablet by mouth 4 (four) times daily., Disp: , Rfl:  .  diazepam (VALIUM) 10 MG tablet, Take 10 mg by mouth every 8 (eight) hours as needed for anxiety., Disp: , Rfl:  .  ferrous sulfate 325 (65 FE) MG tablet, Take 325 mg by mouth daily with breakfast., Disp: , Rfl:  .  HYDROcodone-acetaminophen (NORCO) 10-325 MG per tablet, Take 1 tablet by mouth every 6 (six) hours as needed., Disp: , Rfl:  .  moxifloxacin (VIGAMOX) 0.5 % ophthalmic solution, 1 drop., Disp: , Rfl:  .  niacin 500 MG tablet, Take 500 mg by mouth at bedtime., Disp: , Rfl:  .  omeprazole (PRILOSEC) 40 MG capsule, Take 40 mg by mouth daily., Disp: , Rfl:  .  Potassium Chloride ER 20 MEQ TBCR, Take 20 mEq by mouth daily., Disp: , Rfl:  .  prednisoLONE acetate (PRED FORTE) 1 %  ophthalmic suspension, 1 drop every 2 (two) hours while awake., Disp: , Rfl:  .  promethazine (PHENERGAN) 25 MG tablet, Take 25 mg by mouth 3 (three) times daily as needed for nausea or vomiting., Disp: , Rfl:  .  ranitidine (ZANTAC) 150 MG tablet, Take 150 mg by mouth 2 (two) times daily., Disp: , Rfl:  .  rivaroxaban (XARELTO) 20 MG TABS tablet, Take 20 mg by mouth daily with supper., Disp: , Rfl:  .  rOPINIRole (REQUIP) 0.25 MG tablet, Take 0.25 mg by mouth 3 (three) times daily., Disp: , Rfl:  .  Vitamin D, Ergocalciferol, (DRISDOL) 50000 UNITS CAPS capsule, Take 50,000 Units by mouth every 7 (seven) days., Disp: , Rfl:  .  Fluticasone Furoate-Vilanterol 200-25 MCG/INH AEPB, Inhale into the lungs as directed., Disp: , Rfl:  .  GUAIFENESIN ER PO, Take 600 mg by mouth 2 (two) times daily., Disp: , Rfl:  .  IMIPRAMINE HCL PO, Take 25 mg by mouth daily., Disp: ,  Rfl:  .  ketorolac (ACULAR LS) 0.4 % SOLN, 1 drop 4 (four) times daily., Disp: , Rfl:  .  rizatriptan (MAXALT) 10 MG tablet, Take 10 mg by mouth as needed for migraine. May repeat in 2 hours if needed, Disp: , Rfl:   Filed Vitals:   09/06/14 1440  BP: 123/78  Pulse: 73  Resp: 16  Height: 5\' 10"  (1.778 m)  Weight: 199 lb (90.266 kg)    Body mass index is 28.55 kg/(m^2).           Review of Systems as noted has history of bilateral DVT in the past with pulmonary. Also history of dyspnea on exertion, asthma, urinary discomfort, difficulty ambulating.     Objective:   Physical Exam BP 123/78 mmHg  Pulse 73  Resp 16  Ht 5\' 10"  (1.778 m)  Wt 199 lb (90.266 kg)  BMI 28.55 kg/m2  Gen.-alert and oriented x3 in no apparent distress HEENT normal for age Lungs no rhonchi or wheezing Cardiovascular regular rhythm no murmurs carotid pulses 3+ palpable no bruits audible Abdomen soft nontender no palpable masses Musculoskeletal free of  major deformities Skin clear -no rashes Neurologic normal Lower extremities 3+ femoral and dorsalis pedis pulses palpable bilaterally with no edema-a few scattered varicosities in both lower extremities and the medial and lateral calf areas. No hyperpigmentation or ulceration noted.  Today I ordered bilateral venous duplex exam which I reviewed and interpreted. There is chronic DVT in both legs from the popliteal up to the femoral vein level. Superficial veins are relatively free of reflux. There is no acute DVT noted.       Assessment:     Chronic DVT bilaterally with history of pulmonary embolus currently with IVC filter in place and on chronic anticoagulation A few painful varicosities noted but no superficial venous reflux noted    Plan:     Elevate foot of bed at 10 as he is currently doing Long-leg elastic compression stockings which he has and will continue to wear Nothing further to add to his current regimen. No further venous  workup indicated

## 2014-09-14 DIAGNOSIS — G8929 Other chronic pain: Secondary | ICD-10-CM | POA: Diagnosis not present

## 2014-09-14 DIAGNOSIS — F419 Anxiety disorder, unspecified: Secondary | ICD-10-CM | POA: Diagnosis not present

## 2014-09-14 DIAGNOSIS — Z6830 Body mass index (BMI) 30.0-30.9, adult: Secondary | ICD-10-CM | POA: Diagnosis not present

## 2014-09-14 DIAGNOSIS — I10 Essential (primary) hypertension: Secondary | ICD-10-CM | POA: Diagnosis not present

## 2014-09-16 DIAGNOSIS — I10 Essential (primary) hypertension: Secondary | ICD-10-CM | POA: Insufficient documentation

## 2014-09-16 DIAGNOSIS — I2510 Atherosclerotic heart disease of native coronary artery without angina pectoris: Secondary | ICD-10-CM | POA: Insufficient documentation

## 2014-09-16 DIAGNOSIS — E782 Mixed hyperlipidemia: Secondary | ICD-10-CM | POA: Insufficient documentation

## 2014-09-16 DIAGNOSIS — I252 Old myocardial infarction: Secondary | ICD-10-CM | POA: Insufficient documentation

## 2014-10-14 DIAGNOSIS — Z6830 Body mass index (BMI) 30.0-30.9, adult: Secondary | ICD-10-CM | POA: Diagnosis not present

## 2014-10-14 DIAGNOSIS — F419 Anxiety disorder, unspecified: Secondary | ICD-10-CM | POA: Diagnosis not present

## 2014-10-14 DIAGNOSIS — S8992XA Unspecified injury of left lower leg, initial encounter: Secondary | ICD-10-CM | POA: Diagnosis not present

## 2014-10-14 DIAGNOSIS — G8929 Other chronic pain: Secondary | ICD-10-CM | POA: Diagnosis not present

## 2014-10-15 DIAGNOSIS — M25462 Effusion, left knee: Secondary | ICD-10-CM | POA: Diagnosis not present

## 2014-10-15 DIAGNOSIS — S8992XA Unspecified injury of left lower leg, initial encounter: Secondary | ICD-10-CM | POA: Diagnosis not present

## 2014-10-20 DIAGNOSIS — S8992XA Unspecified injury of left lower leg, initial encounter: Secondary | ICD-10-CM | POA: Diagnosis not present

## 2014-10-20 DIAGNOSIS — R937 Abnormal findings on diagnostic imaging of other parts of musculoskeletal system: Secondary | ICD-10-CM | POA: Diagnosis not present

## 2014-10-20 DIAGNOSIS — M1712 Unilateral primary osteoarthritis, left knee: Secondary | ICD-10-CM | POA: Diagnosis not present

## 2014-10-20 DIAGNOSIS — Y939 Activity, unspecified: Secondary | ICD-10-CM | POA: Diagnosis not present

## 2014-10-20 DIAGNOSIS — X58XXXA Exposure to other specified factors, initial encounter: Secondary | ICD-10-CM | POA: Diagnosis not present

## 2014-10-28 DIAGNOSIS — M1712 Unilateral primary osteoarthritis, left knee: Secondary | ICD-10-CM | POA: Diagnosis not present

## 2014-10-28 DIAGNOSIS — M25562 Pain in left knee: Secondary | ICD-10-CM | POA: Diagnosis not present

## 2014-10-29 DIAGNOSIS — K219 Gastro-esophageal reflux disease without esophagitis: Secondary | ICD-10-CM | POA: Diagnosis not present

## 2014-10-29 DIAGNOSIS — I2510 Atherosclerotic heart disease of native coronary artery without angina pectoris: Secondary | ICD-10-CM | POA: Diagnosis not present

## 2014-10-29 DIAGNOSIS — I10 Essential (primary) hypertension: Secondary | ICD-10-CM | POA: Diagnosis not present

## 2014-10-29 DIAGNOSIS — M6281 Muscle weakness (generalized): Secondary | ICD-10-CM | POA: Diagnosis not present

## 2014-10-29 DIAGNOSIS — J449 Chronic obstructive pulmonary disease, unspecified: Secondary | ICD-10-CM | POA: Diagnosis not present

## 2014-11-02 DIAGNOSIS — J449 Chronic obstructive pulmonary disease, unspecified: Secondary | ICD-10-CM | POA: Diagnosis not present

## 2014-11-02 DIAGNOSIS — K219 Gastro-esophageal reflux disease without esophagitis: Secondary | ICD-10-CM | POA: Diagnosis not present

## 2014-11-02 DIAGNOSIS — I2510 Atherosclerotic heart disease of native coronary artery without angina pectoris: Secondary | ICD-10-CM | POA: Diagnosis not present

## 2014-11-02 DIAGNOSIS — I10 Essential (primary) hypertension: Secondary | ICD-10-CM | POA: Diagnosis not present

## 2014-11-02 DIAGNOSIS — M6281 Muscle weakness (generalized): Secondary | ICD-10-CM | POA: Diagnosis not present

## 2014-11-04 DIAGNOSIS — I10 Essential (primary) hypertension: Secondary | ICD-10-CM | POA: Diagnosis not present

## 2014-11-04 DIAGNOSIS — I2510 Atherosclerotic heart disease of native coronary artery without angina pectoris: Secondary | ICD-10-CM | POA: Diagnosis not present

## 2014-11-04 DIAGNOSIS — J449 Chronic obstructive pulmonary disease, unspecified: Secondary | ICD-10-CM | POA: Diagnosis not present

## 2014-11-04 DIAGNOSIS — K219 Gastro-esophageal reflux disease without esophagitis: Secondary | ICD-10-CM | POA: Diagnosis not present

## 2014-11-04 DIAGNOSIS — M6281 Muscle weakness (generalized): Secondary | ICD-10-CM | POA: Diagnosis not present

## 2014-11-08 DIAGNOSIS — I10 Essential (primary) hypertension: Secondary | ICD-10-CM | POA: Diagnosis not present

## 2014-11-08 DIAGNOSIS — I2510 Atherosclerotic heart disease of native coronary artery without angina pectoris: Secondary | ICD-10-CM | POA: Diagnosis not present

## 2014-11-08 DIAGNOSIS — J449 Chronic obstructive pulmonary disease, unspecified: Secondary | ICD-10-CM | POA: Diagnosis not present

## 2014-11-08 DIAGNOSIS — M6281 Muscle weakness (generalized): Secondary | ICD-10-CM | POA: Diagnosis not present

## 2014-11-08 DIAGNOSIS — K219 Gastro-esophageal reflux disease without esophagitis: Secondary | ICD-10-CM | POA: Diagnosis not present

## 2014-11-11 DIAGNOSIS — J449 Chronic obstructive pulmonary disease, unspecified: Secondary | ICD-10-CM | POA: Diagnosis not present

## 2014-11-11 DIAGNOSIS — I2510 Atherosclerotic heart disease of native coronary artery without angina pectoris: Secondary | ICD-10-CM | POA: Diagnosis not present

## 2014-11-11 DIAGNOSIS — M6281 Muscle weakness (generalized): Secondary | ICD-10-CM | POA: Diagnosis not present

## 2014-11-11 DIAGNOSIS — K219 Gastro-esophageal reflux disease without esophagitis: Secondary | ICD-10-CM | POA: Diagnosis not present

## 2014-11-11 DIAGNOSIS — I10 Essential (primary) hypertension: Secondary | ICD-10-CM | POA: Diagnosis not present

## 2014-11-12 DIAGNOSIS — F419 Anxiety disorder, unspecified: Secondary | ICD-10-CM | POA: Diagnosis not present

## 2014-11-12 DIAGNOSIS — N400 Enlarged prostate without lower urinary tract symptoms: Secondary | ICD-10-CM | POA: Diagnosis not present

## 2014-11-12 DIAGNOSIS — Z79899 Other long term (current) drug therapy: Secondary | ICD-10-CM | POA: Diagnosis not present

## 2014-11-12 DIAGNOSIS — Z6829 Body mass index (BMI) 29.0-29.9, adult: Secondary | ICD-10-CM | POA: Diagnosis not present

## 2014-11-12 DIAGNOSIS — G20 Parkinson's disease: Secondary | ICD-10-CM | POA: Diagnosis not present

## 2014-11-12 DIAGNOSIS — G8929 Other chronic pain: Secondary | ICD-10-CM | POA: Diagnosis not present

## 2014-11-12 DIAGNOSIS — N189 Chronic kidney disease, unspecified: Secondary | ICD-10-CM | POA: Diagnosis not present

## 2014-11-12 DIAGNOSIS — I10 Essential (primary) hypertension: Secondary | ICD-10-CM | POA: Diagnosis not present

## 2014-11-16 DIAGNOSIS — E782 Mixed hyperlipidemia: Secondary | ICD-10-CM | POA: Diagnosis not present

## 2014-11-16 DIAGNOSIS — I2510 Atherosclerotic heart disease of native coronary artery without angina pectoris: Secondary | ICD-10-CM | POA: Diagnosis not present

## 2014-11-16 DIAGNOSIS — I10 Essential (primary) hypertension: Secondary | ICD-10-CM | POA: Diagnosis not present

## 2014-11-16 DIAGNOSIS — G20 Parkinson's disease: Secondary | ICD-10-CM | POA: Diagnosis not present

## 2014-11-25 DIAGNOSIS — R0602 Shortness of breath: Secondary | ICD-10-CM | POA: Diagnosis not present

## 2014-11-25 DIAGNOSIS — I2510 Atherosclerotic heart disease of native coronary artery without angina pectoris: Secondary | ICD-10-CM | POA: Diagnosis not present

## 2014-12-02 DIAGNOSIS — M6281 Muscle weakness (generalized): Secondary | ICD-10-CM | POA: Diagnosis not present

## 2014-12-06 DIAGNOSIS — B351 Tinea unguium: Secondary | ICD-10-CM | POA: Diagnosis not present

## 2014-12-06 DIAGNOSIS — L600 Ingrowing nail: Secondary | ICD-10-CM | POA: Diagnosis not present

## 2014-12-06 DIAGNOSIS — L603 Nail dystrophy: Secondary | ICD-10-CM | POA: Diagnosis not present

## 2014-12-07 DIAGNOSIS — G20 Parkinson's disease: Secondary | ICD-10-CM | POA: Diagnosis not present

## 2014-12-07 DIAGNOSIS — I824Z3 Acute embolism and thrombosis of unspecified deep veins of distal lower extremity, bilateral: Secondary | ICD-10-CM | POA: Diagnosis not present

## 2014-12-07 DIAGNOSIS — E782 Mixed hyperlipidemia: Secondary | ICD-10-CM | POA: Diagnosis not present

## 2014-12-07 DIAGNOSIS — E1165 Type 2 diabetes mellitus with hyperglycemia: Secondary | ICD-10-CM | POA: Diagnosis not present

## 2014-12-07 DIAGNOSIS — I10 Essential (primary) hypertension: Secondary | ICD-10-CM | POA: Diagnosis not present

## 2014-12-07 DIAGNOSIS — K219 Gastro-esophageal reflux disease without esophagitis: Secondary | ICD-10-CM | POA: Diagnosis not present

## 2014-12-07 DIAGNOSIS — Z23 Encounter for immunization: Secondary | ICD-10-CM | POA: Diagnosis not present

## 2014-12-07 DIAGNOSIS — M545 Low back pain: Secondary | ICD-10-CM | POA: Diagnosis not present

## 2014-12-07 DIAGNOSIS — Z5181 Encounter for therapeutic drug level monitoring: Secondary | ICD-10-CM | POA: Diagnosis not present

## 2014-12-07 DIAGNOSIS — M542 Cervicalgia: Secondary | ICD-10-CM | POA: Diagnosis not present

## 2014-12-07 DIAGNOSIS — J438 Other emphysema: Secondary | ICD-10-CM | POA: Diagnosis not present

## 2015-01-10 DIAGNOSIS — M542 Cervicalgia: Secondary | ICD-10-CM | POA: Diagnosis not present

## 2015-01-10 DIAGNOSIS — E782 Mixed hyperlipidemia: Secondary | ICD-10-CM | POA: Diagnosis not present

## 2015-01-10 DIAGNOSIS — K219 Gastro-esophageal reflux disease without esophagitis: Secondary | ICD-10-CM | POA: Diagnosis not present

## 2015-01-10 DIAGNOSIS — G20 Parkinson's disease: Secondary | ICD-10-CM | POA: Diagnosis not present

## 2015-01-10 DIAGNOSIS — J438 Other emphysema: Secondary | ICD-10-CM | POA: Diagnosis not present

## 2015-01-10 DIAGNOSIS — I824Z3 Acute embolism and thrombosis of unspecified deep veins of distal lower extremity, bilateral: Secondary | ICD-10-CM | POA: Diagnosis not present

## 2015-01-10 DIAGNOSIS — M545 Low back pain: Secondary | ICD-10-CM | POA: Diagnosis not present

## 2015-01-12 DIAGNOSIS — R413 Other amnesia: Secondary | ICD-10-CM | POA: Diagnosis not present

## 2015-01-12 DIAGNOSIS — R269 Unspecified abnormalities of gait and mobility: Secondary | ICD-10-CM | POA: Diagnosis not present

## 2015-01-12 DIAGNOSIS — F482 Pseudobulbar affect: Secondary | ICD-10-CM | POA: Diagnosis not present

## 2015-01-12 DIAGNOSIS — G20 Parkinson's disease: Secondary | ICD-10-CM | POA: Diagnosis not present

## 2015-01-12 DIAGNOSIS — F419 Anxiety disorder, unspecified: Secondary | ICD-10-CM | POA: Diagnosis not present

## 2015-01-21 DIAGNOSIS — I10 Essential (primary) hypertension: Secondary | ICD-10-CM | POA: Diagnosis present

## 2015-01-21 DIAGNOSIS — Z862 Personal history of diseases of the blood and blood-forming organs and certain disorders involving the immune mechanism: Secondary | ICD-10-CM | POA: Diagnosis not present

## 2015-01-21 DIAGNOSIS — I2510 Atherosclerotic heart disease of native coronary artery without angina pectoris: Secondary | ICD-10-CM | POA: Diagnosis present

## 2015-01-21 DIAGNOSIS — G20 Parkinson's disease: Secondary | ICD-10-CM | POA: Diagnosis present

## 2015-01-21 DIAGNOSIS — J45909 Unspecified asthma, uncomplicated: Secondary | ICD-10-CM | POA: Diagnosis present

## 2015-01-21 DIAGNOSIS — E7800 Pure hypercholesterolemia, unspecified: Secondary | ICD-10-CM | POA: Diagnosis present

## 2015-01-21 DIAGNOSIS — Z9049 Acquired absence of other specified parts of digestive tract: Secondary | ICD-10-CM | POA: Diagnosis not present

## 2015-01-21 DIAGNOSIS — R109 Unspecified abdominal pain: Secondary | ICD-10-CM | POA: Diagnosis not present

## 2015-01-21 DIAGNOSIS — Z85038 Personal history of other malignant neoplasm of large intestine: Secondary | ICD-10-CM | POA: Diagnosis not present

## 2015-01-21 DIAGNOSIS — R079 Chest pain, unspecified: Secondary | ICD-10-CM | POA: Diagnosis not present

## 2015-01-21 DIAGNOSIS — K219 Gastro-esophageal reflux disease without esophagitis: Secondary | ICD-10-CM | POA: Diagnosis present

## 2015-01-21 DIAGNOSIS — I252 Old myocardial infarction: Secondary | ICD-10-CM | POA: Diagnosis not present

## 2015-01-21 DIAGNOSIS — K5669 Other intestinal obstruction: Secondary | ICD-10-CM | POA: Diagnosis not present

## 2015-01-21 DIAGNOSIS — K5660 Unspecified intestinal obstruction: Secondary | ICD-10-CM | POA: Diagnosis not present

## 2015-01-21 DIAGNOSIS — K6389 Other specified diseases of intestine: Secondary | ICD-10-CM | POA: Diagnosis not present

## 2015-01-21 DIAGNOSIS — K565 Intestinal adhesions [bands] with obstruction (postprocedural) (postinfection): Secondary | ICD-10-CM | POA: Diagnosis not present

## 2015-01-21 DIAGNOSIS — Z79899 Other long term (current) drug therapy: Secondary | ICD-10-CM | POA: Diagnosis not present

## 2015-01-22 DIAGNOSIS — K565 Intestinal adhesions [bands] with obstruction (postprocedural) (postinfection): Secondary | ICD-10-CM | POA: Diagnosis not present

## 2015-01-22 DIAGNOSIS — K5669 Other intestinal obstruction: Secondary | ICD-10-CM | POA: Diagnosis not present

## 2015-01-23 DIAGNOSIS — K565 Intestinal adhesions [bands] with obstruction (postprocedural) (postinfection): Secondary | ICD-10-CM | POA: Diagnosis not present

## 2015-01-23 DIAGNOSIS — R109 Unspecified abdominal pain: Secondary | ICD-10-CM | POA: Diagnosis not present

## 2015-01-24 DIAGNOSIS — K5660 Unspecified intestinal obstruction: Secondary | ICD-10-CM | POA: Diagnosis not present

## 2015-01-24 DIAGNOSIS — R079 Chest pain, unspecified: Secondary | ICD-10-CM | POA: Diagnosis not present

## 2015-01-24 DIAGNOSIS — K565 Intestinal adhesions [bands] with obstruction (postprocedural) (postinfection): Secondary | ICD-10-CM | POA: Diagnosis not present

## 2015-02-01 DIAGNOSIS — M545 Low back pain: Secondary | ICD-10-CM | POA: Diagnosis not present

## 2015-02-01 DIAGNOSIS — M542 Cervicalgia: Secondary | ICD-10-CM | POA: Diagnosis not present

## 2015-02-01 DIAGNOSIS — J438 Other emphysema: Secondary | ICD-10-CM | POA: Diagnosis not present

## 2015-02-01 DIAGNOSIS — G20 Parkinson's disease: Secondary | ICD-10-CM | POA: Diagnosis not present

## 2015-02-08 DIAGNOSIS — G20 Parkinson's disease: Secondary | ICD-10-CM | POA: Diagnosis not present

## 2015-02-08 DIAGNOSIS — M545 Low back pain: Secondary | ICD-10-CM | POA: Diagnosis not present

## 2015-02-08 DIAGNOSIS — M542 Cervicalgia: Secondary | ICD-10-CM | POA: Diagnosis not present

## 2015-02-17 DIAGNOSIS — K5669 Other intestinal obstruction: Secondary | ICD-10-CM | POA: Diagnosis not present

## 2015-02-23 DIAGNOSIS — R630 Anorexia: Secondary | ICD-10-CM | POA: Diagnosis not present

## 2015-03-09 DIAGNOSIS — R252 Cramp and spasm: Secondary | ICD-10-CM | POA: Diagnosis not present

## 2015-03-09 DIAGNOSIS — I10 Essential (primary) hypertension: Secondary | ICD-10-CM | POA: Diagnosis not present

## 2015-03-09 DIAGNOSIS — E559 Vitamin D deficiency, unspecified: Secondary | ICD-10-CM | POA: Diagnosis not present

## 2015-03-09 DIAGNOSIS — E782 Mixed hyperlipidemia: Secondary | ICD-10-CM | POA: Diagnosis not present

## 2015-03-09 DIAGNOSIS — E790 Hyperuricemia without signs of inflammatory arthritis and tophaceous disease: Secondary | ICD-10-CM | POA: Diagnosis not present

## 2015-03-09 DIAGNOSIS — E1165 Type 2 diabetes mellitus with hyperglycemia: Secondary | ICD-10-CM | POA: Diagnosis not present

## 2015-03-09 DIAGNOSIS — E0590 Thyrotoxicosis, unspecified without thyrotoxic crisis or storm: Secondary | ICD-10-CM | POA: Diagnosis not present

## 2015-03-09 DIAGNOSIS — G8929 Other chronic pain: Secondary | ICD-10-CM | POA: Diagnosis not present

## 2015-05-09 DIAGNOSIS — G8929 Other chronic pain: Secondary | ICD-10-CM | POA: Diagnosis not present

## 2015-05-09 DIAGNOSIS — F419 Anxiety disorder, unspecified: Secondary | ICD-10-CM | POA: Diagnosis not present

## 2015-05-09 DIAGNOSIS — E669 Obesity, unspecified: Secondary | ICD-10-CM | POA: Diagnosis not present

## 2015-05-09 DIAGNOSIS — Z5181 Encounter for therapeutic drug level monitoring: Secondary | ICD-10-CM | POA: Diagnosis not present

## 2015-06-06 DIAGNOSIS — E782 Mixed hyperlipidemia: Secondary | ICD-10-CM | POA: Diagnosis not present

## 2015-06-06 DIAGNOSIS — I10 Essential (primary) hypertension: Secondary | ICD-10-CM | POA: Diagnosis not present

## 2015-06-06 DIAGNOSIS — E790 Hyperuricemia without signs of inflammatory arthritis and tophaceous disease: Secondary | ICD-10-CM | POA: Diagnosis not present

## 2015-06-06 DIAGNOSIS — E1165 Type 2 diabetes mellitus with hyperglycemia: Secondary | ICD-10-CM | POA: Diagnosis not present

## 2015-06-06 DIAGNOSIS — E785 Hyperlipidemia, unspecified: Secondary | ICD-10-CM | POA: Diagnosis not present

## 2015-06-06 DIAGNOSIS — R252 Cramp and spasm: Secondary | ICD-10-CM | POA: Diagnosis not present

## 2015-06-06 DIAGNOSIS — E039 Hypothyroidism, unspecified: Secondary | ICD-10-CM | POA: Diagnosis not present

## 2015-06-07 DIAGNOSIS — H26493 Other secondary cataract, bilateral: Secondary | ICD-10-CM | POA: Diagnosis not present

## 2015-06-07 DIAGNOSIS — H524 Presbyopia: Secondary | ICD-10-CM | POA: Diagnosis not present

## 2015-06-14 DIAGNOSIS — J4530 Mild persistent asthma, uncomplicated: Secondary | ICD-10-CM | POA: Diagnosis not present

## 2015-06-14 DIAGNOSIS — G4733 Obstructive sleep apnea (adult) (pediatric): Secondary | ICD-10-CM | POA: Diagnosis not present

## 2015-06-14 DIAGNOSIS — J310 Chronic rhinitis: Secondary | ICD-10-CM | POA: Diagnosis not present

## 2015-06-16 DIAGNOSIS — R1013 Epigastric pain: Secondary | ICD-10-CM | POA: Diagnosis not present

## 2015-06-16 DIAGNOSIS — K5900 Constipation, unspecified: Secondary | ICD-10-CM | POA: Diagnosis not present

## 2015-06-16 DIAGNOSIS — R1032 Left lower quadrant pain: Secondary | ICD-10-CM | POA: Diagnosis not present

## 2015-06-24 DIAGNOSIS — J0100 Acute maxillary sinusitis, unspecified: Secondary | ICD-10-CM | POA: Diagnosis not present

## 2015-07-06 DIAGNOSIS — I10 Essential (primary) hypertension: Secondary | ICD-10-CM | POA: Diagnosis not present

## 2015-07-06 DIAGNOSIS — I2510 Atherosclerotic heart disease of native coronary artery without angina pectoris: Secondary | ICD-10-CM | POA: Diagnosis not present

## 2015-07-06 DIAGNOSIS — G20 Parkinson's disease: Secondary | ICD-10-CM | POA: Diagnosis not present

## 2015-07-06 DIAGNOSIS — Z86718 Personal history of other venous thrombosis and embolism: Secondary | ICD-10-CM | POA: Diagnosis not present

## 2015-07-13 DIAGNOSIS — F419 Anxiety disorder, unspecified: Secondary | ICD-10-CM | POA: Diagnosis not present

## 2015-07-13 DIAGNOSIS — F482 Pseudobulbar affect: Secondary | ICD-10-CM | POA: Diagnosis not present

## 2015-07-13 DIAGNOSIS — M542 Cervicalgia: Secondary | ICD-10-CM | POA: Diagnosis not present

## 2015-07-13 DIAGNOSIS — R269 Unspecified abnormalities of gait and mobility: Secondary | ICD-10-CM | POA: Diagnosis not present

## 2015-07-13 DIAGNOSIS — Z9181 History of falling: Secondary | ICD-10-CM | POA: Insufficient documentation

## 2015-07-13 DIAGNOSIS — G20 Parkinson's disease: Secondary | ICD-10-CM | POA: Diagnosis not present

## 2015-07-18 DIAGNOSIS — G20 Parkinson's disease: Secondary | ICD-10-CM | POA: Diagnosis not present

## 2015-07-18 DIAGNOSIS — Z86718 Personal history of other venous thrombosis and embolism: Secondary | ICD-10-CM | POA: Diagnosis not present

## 2015-07-18 DIAGNOSIS — I10 Essential (primary) hypertension: Secondary | ICD-10-CM | POA: Diagnosis not present

## 2015-07-18 DIAGNOSIS — I2510 Atherosclerotic heart disease of native coronary artery without angina pectoris: Secondary | ICD-10-CM | POA: Diagnosis not present

## 2015-07-28 DIAGNOSIS — E559 Vitamin D deficiency, unspecified: Secondary | ICD-10-CM | POA: Diagnosis not present

## 2015-07-28 DIAGNOSIS — R809 Proteinuria, unspecified: Secondary | ICD-10-CM | POA: Diagnosis not present

## 2015-07-28 DIAGNOSIS — D649 Anemia, unspecified: Secondary | ICD-10-CM | POA: Diagnosis not present

## 2015-07-28 DIAGNOSIS — I10 Essential (primary) hypertension: Secondary | ICD-10-CM | POA: Diagnosis not present

## 2015-07-28 DIAGNOSIS — N189 Chronic kidney disease, unspecified: Secondary | ICD-10-CM | POA: Diagnosis not present

## 2015-07-28 DIAGNOSIS — M109 Gout, unspecified: Secondary | ICD-10-CM | POA: Diagnosis not present

## 2015-07-28 DIAGNOSIS — N250 Renal osteodystrophy: Secondary | ICD-10-CM | POA: Diagnosis not present

## 2015-07-28 DIAGNOSIS — N183 Chronic kidney disease, stage 3 (moderate): Secondary | ICD-10-CM | POA: Diagnosis not present

## 2015-07-28 DIAGNOSIS — E782 Mixed hyperlipidemia: Secondary | ICD-10-CM | POA: Diagnosis not present

## 2015-08-02 DIAGNOSIS — M25569 Pain in unspecified knee: Secondary | ICD-10-CM | POA: Diagnosis not present

## 2015-08-02 DIAGNOSIS — M19072 Primary osteoarthritis, left ankle and foot: Secondary | ICD-10-CM | POA: Diagnosis not present

## 2015-08-02 DIAGNOSIS — M545 Low back pain: Secondary | ICD-10-CM | POA: Diagnosis not present

## 2015-08-10 DIAGNOSIS — T25121A Burn of first degree of right foot, initial encounter: Secondary | ICD-10-CM | POA: Diagnosis not present

## 2015-08-10 DIAGNOSIS — T25122A Burn of first degree of left foot, initial encounter: Secondary | ICD-10-CM | POA: Diagnosis not present

## 2015-08-15 DIAGNOSIS — T25021A Burn of unspecified degree of right foot, initial encounter: Secondary | ICD-10-CM | POA: Diagnosis not present

## 2015-08-30 DIAGNOSIS — E782 Mixed hyperlipidemia: Secondary | ICD-10-CM | POA: Diagnosis not present

## 2015-08-30 DIAGNOSIS — I10 Essential (primary) hypertension: Secondary | ICD-10-CM | POA: Diagnosis not present

## 2015-08-30 DIAGNOSIS — E039 Hypothyroidism, unspecified: Secondary | ICD-10-CM | POA: Diagnosis not present

## 2015-08-30 DIAGNOSIS — E559 Vitamin D deficiency, unspecified: Secondary | ICD-10-CM | POA: Diagnosis not present

## 2015-08-30 DIAGNOSIS — R252 Cramp and spasm: Secondary | ICD-10-CM | POA: Diagnosis not present

## 2015-08-30 DIAGNOSIS — E1165 Type 2 diabetes mellitus with hyperglycemia: Secondary | ICD-10-CM | POA: Diagnosis not present

## 2015-08-30 DIAGNOSIS — E790 Hyperuricemia without signs of inflammatory arthritis and tophaceous disease: Secondary | ICD-10-CM | POA: Diagnosis not present

## 2015-09-28 DIAGNOSIS — R05 Cough: Secondary | ICD-10-CM | POA: Diagnosis not present

## 2015-09-28 DIAGNOSIS — R300 Dysuria: Secondary | ICD-10-CM | POA: Diagnosis not present

## 2015-09-28 DIAGNOSIS — N183 Chronic kidney disease, stage 3 (moderate): Secondary | ICD-10-CM | POA: Diagnosis not present

## 2015-09-28 DIAGNOSIS — N390 Urinary tract infection, site not specified: Secondary | ICD-10-CM | POA: Diagnosis not present

## 2015-09-28 DIAGNOSIS — R0600 Dyspnea, unspecified: Secondary | ICD-10-CM | POA: Diagnosis not present

## 2015-09-28 DIAGNOSIS — G8929 Other chronic pain: Secondary | ICD-10-CM | POA: Diagnosis not present

## 2015-09-28 DIAGNOSIS — I9589 Other hypotension: Secondary | ICD-10-CM | POA: Diagnosis not present

## 2015-10-12 DIAGNOSIS — G4733 Obstructive sleep apnea (adult) (pediatric): Secondary | ICD-10-CM | POA: Diagnosis not present

## 2015-10-12 DIAGNOSIS — R5383 Other fatigue: Secondary | ICD-10-CM | POA: Diagnosis not present

## 2015-10-12 DIAGNOSIS — J310 Chronic rhinitis: Secondary | ICD-10-CM | POA: Diagnosis not present

## 2015-10-12 DIAGNOSIS — J4530 Mild persistent asthma, uncomplicated: Secondary | ICD-10-CM | POA: Diagnosis not present

## 2015-11-02 DIAGNOSIS — G20 Parkinson's disease: Secondary | ICD-10-CM | POA: Diagnosis not present

## 2015-11-02 DIAGNOSIS — G894 Chronic pain syndrome: Secondary | ICD-10-CM | POA: Diagnosis not present

## 2015-11-02 DIAGNOSIS — M4692 Unspecified inflammatory spondylopathy, cervical region: Secondary | ICD-10-CM | POA: Diagnosis not present

## 2015-11-02 DIAGNOSIS — Z79899 Other long term (current) drug therapy: Secondary | ICD-10-CM | POA: Diagnosis not present

## 2015-11-02 DIAGNOSIS — F419 Anxiety disorder, unspecified: Secondary | ICD-10-CM | POA: Diagnosis not present

## 2015-11-28 DIAGNOSIS — G894 Chronic pain syndrome: Secondary | ICD-10-CM | POA: Diagnosis not present

## 2015-11-28 DIAGNOSIS — M545 Low back pain: Secondary | ICD-10-CM | POA: Diagnosis not present

## 2015-11-28 DIAGNOSIS — G8911 Acute pain due to trauma: Secondary | ICD-10-CM | POA: Diagnosis not present

## 2015-11-28 DIAGNOSIS — G20 Parkinson's disease: Secondary | ICD-10-CM | POA: Diagnosis not present

## 2015-12-01 DIAGNOSIS — R309 Painful micturition, unspecified: Secondary | ICD-10-CM | POA: Diagnosis not present

## 2015-12-01 DIAGNOSIS — I10 Essential (primary) hypertension: Secondary | ICD-10-CM | POA: Diagnosis not present

## 2015-12-01 DIAGNOSIS — E559 Vitamin D deficiency, unspecified: Secondary | ICD-10-CM | POA: Diagnosis not present

## 2015-12-01 DIAGNOSIS — D649 Anemia, unspecified: Secondary | ICD-10-CM | POA: Diagnosis not present

## 2015-12-01 DIAGNOSIS — N250 Renal osteodystrophy: Secondary | ICD-10-CM | POA: Diagnosis not present

## 2015-12-01 DIAGNOSIS — M109 Gout, unspecified: Secondary | ICD-10-CM | POA: Diagnosis not present

## 2015-12-01 DIAGNOSIS — G4733 Obstructive sleep apnea (adult) (pediatric): Secondary | ICD-10-CM | POA: Diagnosis not present

## 2015-12-01 DIAGNOSIS — N189 Chronic kidney disease, unspecified: Secondary | ICD-10-CM | POA: Diagnosis not present

## 2015-12-01 DIAGNOSIS — N183 Chronic kidney disease, stage 3 (moderate): Secondary | ICD-10-CM | POA: Diagnosis not present

## 2015-12-01 DIAGNOSIS — E782 Mixed hyperlipidemia: Secondary | ICD-10-CM | POA: Diagnosis not present

## 2015-12-01 DIAGNOSIS — E211 Secondary hyperparathyroidism, not elsewhere classified: Secondary | ICD-10-CM | POA: Diagnosis not present

## 2015-12-23 DIAGNOSIS — J310 Chronic rhinitis: Secondary | ICD-10-CM | POA: Diagnosis not present

## 2015-12-23 DIAGNOSIS — Z23 Encounter for immunization: Secondary | ICD-10-CM | POA: Diagnosis not present

## 2015-12-23 DIAGNOSIS — R5383 Other fatigue: Secondary | ICD-10-CM | POA: Diagnosis not present

## 2015-12-23 DIAGNOSIS — J4530 Mild persistent asthma, uncomplicated: Secondary | ICD-10-CM | POA: Diagnosis not present

## 2015-12-27 DIAGNOSIS — N390 Urinary tract infection, site not specified: Secondary | ICD-10-CM | POA: Diagnosis not present

## 2015-12-27 DIAGNOSIS — G8929 Other chronic pain: Secondary | ICD-10-CM | POA: Diagnosis not present

## 2015-12-27 DIAGNOSIS — R251 Tremor, unspecified: Secondary | ICD-10-CM | POA: Diagnosis not present

## 2015-12-27 DIAGNOSIS — Z13820 Encounter for screening for osteoporosis: Secondary | ICD-10-CM | POA: Diagnosis not present

## 2015-12-27 DIAGNOSIS — D649 Anemia, unspecified: Secondary | ICD-10-CM | POA: Diagnosis not present

## 2015-12-27 DIAGNOSIS — I10 Essential (primary) hypertension: Secondary | ICD-10-CM | POA: Diagnosis not present

## 2015-12-27 DIAGNOSIS — E785 Hyperlipidemia, unspecified: Secondary | ICD-10-CM | POA: Diagnosis not present

## 2015-12-27 DIAGNOSIS — I749 Embolism and thrombosis of unspecified artery: Secondary | ICD-10-CM | POA: Diagnosis not present

## 2016-01-05 DIAGNOSIS — I10 Essential (primary) hypertension: Secondary | ICD-10-CM | POA: Diagnosis not present

## 2016-01-05 DIAGNOSIS — I2510 Atherosclerotic heart disease of native coronary artery without angina pectoris: Secondary | ICD-10-CM | POA: Diagnosis not present

## 2016-01-05 DIAGNOSIS — I252 Old myocardial infarction: Secondary | ICD-10-CM | POA: Diagnosis not present

## 2016-01-05 DIAGNOSIS — E782 Mixed hyperlipidemia: Secondary | ICD-10-CM | POA: Diagnosis not present

## 2016-01-05 DIAGNOSIS — G20 Parkinson's disease: Secondary | ICD-10-CM | POA: Diagnosis not present

## 2016-01-05 DIAGNOSIS — Z86718 Personal history of other venous thrombosis and embolism: Secondary | ICD-10-CM | POA: Diagnosis not present

## 2016-01-10 DIAGNOSIS — G8929 Other chronic pain: Secondary | ICD-10-CM | POA: Diagnosis not present

## 2016-01-10 DIAGNOSIS — R251 Tremor, unspecified: Secondary | ICD-10-CM | POA: Diagnosis not present

## 2016-01-10 DIAGNOSIS — I749 Embolism and thrombosis of unspecified artery: Secondary | ICD-10-CM | POA: Diagnosis not present

## 2016-01-10 DIAGNOSIS — I10 Essential (primary) hypertension: Secondary | ICD-10-CM | POA: Diagnosis not present

## 2016-01-10 DIAGNOSIS — D649 Anemia, unspecified: Secondary | ICD-10-CM | POA: Diagnosis not present

## 2016-01-10 DIAGNOSIS — N390 Urinary tract infection, site not specified: Secondary | ICD-10-CM | POA: Diagnosis not present

## 2016-01-17 DIAGNOSIS — M949 Disorder of cartilage, unspecified: Secondary | ICD-10-CM | POA: Diagnosis not present

## 2016-01-17 DIAGNOSIS — N390 Urinary tract infection, site not specified: Secondary | ICD-10-CM | POA: Diagnosis not present

## 2016-01-17 DIAGNOSIS — D649 Anemia, unspecified: Secondary | ICD-10-CM | POA: Diagnosis not present

## 2016-01-17 DIAGNOSIS — J018 Other acute sinusitis: Secondary | ICD-10-CM | POA: Diagnosis not present

## 2016-01-17 DIAGNOSIS — R5381 Other malaise: Secondary | ICD-10-CM | POA: Diagnosis not present

## 2016-01-17 DIAGNOSIS — I10 Essential (primary) hypertension: Secondary | ICD-10-CM | POA: Diagnosis not present

## 2016-01-17 DIAGNOSIS — G8929 Other chronic pain: Secondary | ICD-10-CM | POA: Diagnosis not present

## 2016-01-17 DIAGNOSIS — R251 Tremor, unspecified: Secondary | ICD-10-CM | POA: Diagnosis not present

## 2016-01-19 DIAGNOSIS — R269 Unspecified abnormalities of gait and mobility: Secondary | ICD-10-CM | POA: Diagnosis not present

## 2016-01-19 DIAGNOSIS — F482 Pseudobulbar affect: Secondary | ICD-10-CM | POA: Diagnosis not present

## 2016-01-19 DIAGNOSIS — G20 Parkinson's disease: Secondary | ICD-10-CM | POA: Diagnosis not present

## 2016-01-19 DIAGNOSIS — F419 Anxiety disorder, unspecified: Secondary | ICD-10-CM | POA: Diagnosis not present

## 2016-01-19 DIAGNOSIS — R413 Other amnesia: Secondary | ICD-10-CM | POA: Diagnosis not present

## 2016-01-19 DIAGNOSIS — M542 Cervicalgia: Secondary | ICD-10-CM | POA: Diagnosis not present

## 2016-01-24 DIAGNOSIS — R251 Tremor, unspecified: Secondary | ICD-10-CM | POA: Diagnosis not present

## 2016-01-24 DIAGNOSIS — N390 Urinary tract infection, site not specified: Secondary | ICD-10-CM | POA: Diagnosis not present

## 2016-01-24 DIAGNOSIS — J4520 Mild intermittent asthma, uncomplicated: Secondary | ICD-10-CM | POA: Diagnosis not present

## 2016-01-24 DIAGNOSIS — G4733 Obstructive sleep apnea (adult) (pediatric): Secondary | ICD-10-CM | POA: Diagnosis not present

## 2016-01-24 DIAGNOSIS — I10 Essential (primary) hypertension: Secondary | ICD-10-CM | POA: Diagnosis not present

## 2016-01-24 DIAGNOSIS — J4530 Mild persistent asthma, uncomplicated: Secondary | ICD-10-CM | POA: Diagnosis not present

## 2016-01-24 DIAGNOSIS — G8929 Other chronic pain: Secondary | ICD-10-CM | POA: Diagnosis not present

## 2016-02-14 DIAGNOSIS — G8929 Other chronic pain: Secondary | ICD-10-CM | POA: Diagnosis not present

## 2016-02-14 DIAGNOSIS — R251 Tremor, unspecified: Secondary | ICD-10-CM | POA: Diagnosis not present

## 2016-02-14 DIAGNOSIS — N390 Urinary tract infection, site not specified: Secondary | ICD-10-CM | POA: Diagnosis not present

## 2016-02-14 DIAGNOSIS — I10 Essential (primary) hypertension: Secondary | ICD-10-CM | POA: Diagnosis not present

## 2016-03-14 DIAGNOSIS — Z1389 Encounter for screening for other disorder: Secondary | ICD-10-CM | POA: Diagnosis not present

## 2016-03-14 DIAGNOSIS — Z008 Encounter for other general examination: Secondary | ICD-10-CM | POA: Diagnosis not present

## 2016-03-14 DIAGNOSIS — Z6830 Body mass index (BMI) 30.0-30.9, adult: Secondary | ICD-10-CM | POA: Diagnosis not present

## 2016-03-14 DIAGNOSIS — R251 Tremor, unspecified: Secondary | ICD-10-CM | POA: Diagnosis not present

## 2016-03-14 DIAGNOSIS — J309 Allergic rhinitis, unspecified: Secondary | ICD-10-CM | POA: Diagnosis not present

## 2016-03-14 DIAGNOSIS — Z91018 Allergy to other foods: Secondary | ICD-10-CM | POA: Diagnosis not present

## 2016-03-14 DIAGNOSIS — Z1211 Encounter for screening for malignant neoplasm of colon: Secondary | ICD-10-CM | POA: Diagnosis not present

## 2016-03-14 DIAGNOSIS — G8929 Other chronic pain: Secondary | ICD-10-CM | POA: Diagnosis not present

## 2016-03-14 DIAGNOSIS — I10 Essential (primary) hypertension: Secondary | ICD-10-CM | POA: Diagnosis not present

## 2016-04-13 DIAGNOSIS — E119 Type 2 diabetes mellitus without complications: Secondary | ICD-10-CM | POA: Diagnosis not present

## 2016-04-13 DIAGNOSIS — M25579 Pain in unspecified ankle and joints of unspecified foot: Secondary | ICD-10-CM | POA: Diagnosis not present

## 2016-04-13 DIAGNOSIS — M25472 Effusion, left ankle: Secondary | ICD-10-CM | POA: Diagnosis not present

## 2016-04-13 DIAGNOSIS — G8929 Other chronic pain: Secondary | ICD-10-CM | POA: Diagnosis not present

## 2016-04-26 ENCOUNTER — Encounter: Payer: Self-pay | Admitting: Sports Medicine

## 2016-04-26 ENCOUNTER — Ambulatory Visit (INDEPENDENT_AMBULATORY_CARE_PROVIDER_SITE_OTHER): Payer: Medicare Other | Admitting: Sports Medicine

## 2016-04-26 DIAGNOSIS — M2042 Other hammer toe(s) (acquired), left foot: Secondary | ICD-10-CM | POA: Diagnosis not present

## 2016-04-26 DIAGNOSIS — G20 Parkinson's disease: Secondary | ICD-10-CM

## 2016-04-26 DIAGNOSIS — M79675 Pain in left toe(s): Secondary | ICD-10-CM | POA: Diagnosis not present

## 2016-04-26 DIAGNOSIS — M2041 Other hammer toe(s) (acquired), right foot: Secondary | ICD-10-CM

## 2016-04-26 DIAGNOSIS — M79674 Pain in right toe(s): Secondary | ICD-10-CM | POA: Diagnosis not present

## 2016-04-26 DIAGNOSIS — I739 Peripheral vascular disease, unspecified: Secondary | ICD-10-CM

## 2016-04-26 DIAGNOSIS — B351 Tinea unguium: Secondary | ICD-10-CM

## 2016-04-26 NOTE — Progress Notes (Signed)
Subjective: Riley Ford is a 82 y.o. male patient seen today in office with complaint of painful thickened and elongated toenails; unable to trim. Patient denies history of Diabetes. Admits to history of Parkinson's with drawing of the toes, left worse than right. Patient is assisted by daughter who states that she performs her dads nail care at home and has noticed that the nails are cracking and splitting more with a lot of yellow and dark discoloration. Patient has been started on Lamisil by primary care doctor and has recommended to use bleach to soak with.  Patient has no other pedal complaints at this time.   Patient Active Problem List   Diagnosis Date Noted  . Risk for falls 07/13/2015  . History of DVT (deep vein thrombosis) 07/06/2015  . Coronary atherosclerosis of native coronary artery 09/16/2014  . Essential hypertension, benign 09/16/2014  . Mixed hyperlipidemia 09/16/2014  . Old myocardial infarction 09/16/2014  . Gait difficulty 02/24/2014  . Anxiety 10/07/2013  . Neck pain 10/07/2013  . Parkinson's disease ([place]) 10/07/2013  . Pseudobulbar affect 10/07/2013  . Right-sided chest wall pain 10/07/2013    Current Outpatient Prescriptions on File Prior to Visit  Medication Sig Dispense Refill  . Albuterol Sulfate 108 (90 BASE) MCG/ACT AEPB Inhale into the lungs as directed.    Marland Kitchen ALFUZOSIN HCL ER PO Take 10 mg by mouth daily.    Marland Kitchen aspirin 81 MG chewable tablet Chew 81 mg by mouth daily.    Marland Kitchen atorvastatin (LIPITOR) 40 MG tablet Take 40 mg by mouth daily.    . Azelastine-Fluticasone 137-50 MCG/ACT SUSP Place into the nose as directed.    Marland Kitchen BENAZEPRIL HCL PO Take 5 mg by mouth daily.    . carbidopa-levodopa (SINEMET IR) 25-250 MG per tablet Take 1 tablet by mouth 4 (four) times daily.    . diazepam (VALIUM) 10 MG tablet Take 10 mg by mouth every 8 (eight) hours as needed for anxiety.    . ferrous sulfate 325 (65 FE) MG tablet Take 325 mg by mouth daily with breakfast.    .  Fluticasone Furoate-Vilanterol 200-25 MCG/INH AEPB Inhale into the lungs as directed.    . GUAIFENESIN ER PO Take 600 mg by mouth 2 (two) times daily.    Marland Kitchen HYDROcodone-acetaminophen (NORCO) 10-325 MG per tablet Take 1 tablet by mouth every 6 (six) hours as needed.    . IMIPRAMINE HCL PO Take 25 mg by mouth daily.    Marland Kitchen ketorolac (ACULAR LS) 0.4 % SOLN 1 drop 4 (four) times daily.    Marland Kitchen moxifloxacin (VIGAMOX) 0.5 % ophthalmic solution 1 drop.    . niacin 500 MG tablet Take 500 mg by mouth at bedtime.    Marland Kitchen omeprazole (PRILOSEC) 40 MG capsule Take 40 mg by mouth daily.    . Potassium Chloride ER 20 MEQ TBCR Take 20 mEq by mouth daily.    . prednisoLONE acetate (PRED FORTE) 1 % ophthalmic suspension 1 drop every 2 (two) hours while awake.    . promethazine (PHENERGAN) 25 MG tablet Take 25 mg by mouth 3 (three) times daily as needed for nausea or vomiting.    . ranitidine (ZANTAC) 150 MG tablet Take 150 mg by mouth 2 (two) times daily.    . rivaroxaban (XARELTO) 20 MG TABS tablet Take 20 mg by mouth daily with supper.    . rizatriptan (MAXALT) 10 MG tablet Take 10 mg by mouth as needed for migraine. May repeat in 2 hours if needed    .  rOPINIRole (REQUIP) 0.25 MG tablet Take 0.25 mg by mouth 3 (three) times daily.    . Vitamin D, Ergocalciferol, (DRISDOL) 50000 UNITS CAPS capsule Take 50,000 Units by mouth every 7 (seven) days.     No current facility-administered medications on file prior to visit.     Allergies  Allergen Reactions  . Codeine Hives and Rash    Pt states he can tolerate codeine if he takes Benadryl with it.  jkl  . Ibuprofen Other (See Comments)    Kidney Disease  . Latex Rash  . Naproxen Other (See Comments)    Kidney Disease    Objective: Physical Exam  General: Well developed, nourished, no acute distress, awake, alert and oriented x 3 with tremor  Vascular: Dorsalis pedis artery 1/4 bilateral, Posterior tibial artery 1/4 bilateral, skin temperature warm to warm  proximal to distal bilateral lower extremities, moderate varicosities, no pedal hair present bilateral. Trace edema present bilateral.  Neurological: Gross sensation present via light touch bilateral. Occasional spasms and restless leg bilateral history of Parkinson's disease.  Dermatological: Skin is warm, dry, and supple bilateral, Nails 1-10 are tender, long, thick, and discolored with moderate subungal debris, no webspace macerations present bilateral, no open lesions present bilateral, no callus/corns/hyperkeratotic tissue present bilateral. No signs of infection bilateral.  Musculoskeletal: Asymptomatic hammertoe boney deformities noted bilateral. Muscular strength within normal limits without painon range of motion. No pain with calf compression bilateral.  Assessment and Plan:  Problem List Items Addressed This Visit    None    Visit Diagnoses    Dermatophytosis of nail    -  Primary   Toe pain, bilateral       PVD (peripheral vascular disease) ([place])       Parkinson disease ([place])       Hammertoes of both feet          -Examined patient.  -Discussed treatment options for painful mycotic nails. -Mechanically debrided and reduced mycotic nails with sterile nail nipper and dremel nail file without incident. -Advised patient to discontinue soaking with bleach and to use  white distilled vinegar instead -Recommend to treat old to nails daily -Advised patient that he may continue with oral Lamisil as prescribed by his primary care doctor, however, he must make sure that they are closely monitor his liver since this is a high-risk medication  -Recommend good supportive shoes for foot type -Recommend patient to use cane for stability in gait secondary to Parkinson's  -Patient to return in 3 months for follow up evaluation or sooner if symptoms worsen.  Landis Martins, DPM

## 2016-05-11 DIAGNOSIS — G8929 Other chronic pain: Secondary | ICD-10-CM | POA: Diagnosis not present

## 2016-05-11 DIAGNOSIS — Z131 Encounter for screening for diabetes mellitus: Secondary | ICD-10-CM | POA: Diagnosis not present

## 2016-05-11 DIAGNOSIS — I10 Essential (primary) hypertension: Secondary | ICD-10-CM | POA: Diagnosis not present

## 2016-06-11 DIAGNOSIS — Z131 Encounter for screening for diabetes mellitus: Secondary | ICD-10-CM | POA: Diagnosis not present

## 2016-06-11 DIAGNOSIS — I10 Essential (primary) hypertension: Secondary | ICD-10-CM | POA: Diagnosis not present

## 2016-06-11 DIAGNOSIS — G8929 Other chronic pain: Secondary | ICD-10-CM | POA: Diagnosis not present

## 2016-06-11 DIAGNOSIS — N390 Urinary tract infection, site not specified: Secondary | ICD-10-CM | POA: Diagnosis not present

## 2016-07-11 DIAGNOSIS — G8929 Other chronic pain: Secondary | ICD-10-CM | POA: Diagnosis not present

## 2016-07-11 DIAGNOSIS — M542 Cervicalgia: Secondary | ICD-10-CM | POA: Diagnosis not present

## 2016-07-12 DIAGNOSIS — I10 Essential (primary) hypertension: Secondary | ICD-10-CM | POA: Diagnosis not present

## 2016-07-12 DIAGNOSIS — I252 Old myocardial infarction: Secondary | ICD-10-CM | POA: Diagnosis not present

## 2016-07-12 DIAGNOSIS — G20 Parkinson's disease: Secondary | ICD-10-CM | POA: Diagnosis not present

## 2016-07-12 DIAGNOSIS — Z86718 Personal history of other venous thrombosis and embolism: Secondary | ICD-10-CM | POA: Diagnosis not present

## 2016-07-12 DIAGNOSIS — I2510 Atherosclerotic heart disease of native coronary artery without angina pectoris: Secondary | ICD-10-CM | POA: Diagnosis not present

## 2016-07-17 DIAGNOSIS — R079 Chest pain, unspecified: Secondary | ICD-10-CM | POA: Diagnosis not present

## 2016-07-17 DIAGNOSIS — I10 Essential (primary) hypertension: Secondary | ICD-10-CM | POA: Diagnosis not present

## 2016-07-17 DIAGNOSIS — I2510 Atherosclerotic heart disease of native coronary artery without angina pectoris: Secondary | ICD-10-CM | POA: Diagnosis not present

## 2016-07-17 DIAGNOSIS — I252 Old myocardial infarction: Secondary | ICD-10-CM | POA: Diagnosis not present

## 2016-07-18 DIAGNOSIS — G20 Parkinson's disease: Secondary | ICD-10-CM | POA: Diagnosis not present

## 2016-07-18 DIAGNOSIS — F419 Anxiety disorder, unspecified: Secondary | ICD-10-CM | POA: Diagnosis not present

## 2016-07-18 DIAGNOSIS — G8929 Other chronic pain: Secondary | ICD-10-CM | POA: Diagnosis not present

## 2016-07-18 DIAGNOSIS — M545 Low back pain: Secondary | ICD-10-CM | POA: Diagnosis not present

## 2016-07-18 DIAGNOSIS — F482 Pseudobulbar affect: Secondary | ICD-10-CM | POA: Diagnosis not present

## 2016-07-26 ENCOUNTER — Ambulatory Visit: Payer: Medicare Other | Admitting: Sports Medicine

## 2016-08-13 DIAGNOSIS — G8929 Other chronic pain: Secondary | ICD-10-CM | POA: Diagnosis not present

## 2016-08-13 DIAGNOSIS — Z6830 Body mass index (BMI) 30.0-30.9, adult: Secondary | ICD-10-CM | POA: Diagnosis not present

## 2016-08-13 DIAGNOSIS — M542 Cervicalgia: Secondary | ICD-10-CM | POA: Diagnosis not present

## 2016-08-13 DIAGNOSIS — E785 Hyperlipidemia, unspecified: Secondary | ICD-10-CM | POA: Diagnosis not present

## 2016-08-13 DIAGNOSIS — I10 Essential (primary) hypertension: Secondary | ICD-10-CM | POA: Diagnosis not present

## 2016-08-13 DIAGNOSIS — Z9181 History of falling: Secondary | ICD-10-CM | POA: Diagnosis not present

## 2016-09-16 DIAGNOSIS — N281 Cyst of kidney, acquired: Secondary | ICD-10-CM | POA: Diagnosis not present

## 2016-09-16 DIAGNOSIS — I2510 Atherosclerotic heart disease of native coronary artery without angina pectoris: Secondary | ICD-10-CM | POA: Diagnosis not present

## 2016-09-16 DIAGNOSIS — D500 Iron deficiency anemia secondary to blood loss (chronic): Secondary | ICD-10-CM | POA: Diagnosis not present

## 2016-09-16 DIAGNOSIS — K5900 Constipation, unspecified: Secondary | ICD-10-CM | POA: Diagnosis not present

## 2016-09-16 DIAGNOSIS — I10 Essential (primary) hypertension: Secondary | ICD-10-CM | POA: Diagnosis not present

## 2016-09-16 DIAGNOSIS — G20 Parkinson's disease: Secondary | ICD-10-CM | POA: Diagnosis not present

## 2016-09-16 DIAGNOSIS — J449 Chronic obstructive pulmonary disease, unspecified: Secondary | ICD-10-CM | POA: Diagnosis not present

## 2016-09-16 DIAGNOSIS — N184 Chronic kidney disease, stage 4 (severe): Secondary | ICD-10-CM | POA: Diagnosis not present

## 2016-09-16 DIAGNOSIS — D649 Anemia, unspecified: Secondary | ICD-10-CM | POA: Diagnosis not present

## 2016-09-16 DIAGNOSIS — K922 Gastrointestinal hemorrhage, unspecified: Secondary | ICD-10-CM | POA: Diagnosis not present

## 2016-09-17 DIAGNOSIS — E611 Iron deficiency: Secondary | ICD-10-CM | POA: Diagnosis not present

## 2016-09-17 DIAGNOSIS — Z85048 Personal history of other malignant neoplasm of rectum, rectosigmoid junction, and anus: Secondary | ICD-10-CM | POA: Diagnosis not present

## 2016-09-17 DIAGNOSIS — Z86718 Personal history of other venous thrombosis and embolism: Secondary | ICD-10-CM | POA: Diagnosis not present

## 2016-09-17 DIAGNOSIS — K922 Gastrointestinal hemorrhage, unspecified: Secondary | ICD-10-CM | POA: Diagnosis not present

## 2016-09-17 DIAGNOSIS — I2510 Atherosclerotic heart disease of native coronary artery without angina pectoris: Secondary | ICD-10-CM | POA: Diagnosis not present

## 2016-09-17 DIAGNOSIS — Z79899 Other long term (current) drug therapy: Secondary | ICD-10-CM | POA: Diagnosis not present

## 2016-09-17 DIAGNOSIS — R1310 Dysphagia, unspecified: Secondary | ICD-10-CM | POA: Diagnosis not present

## 2016-09-17 DIAGNOSIS — K5900 Constipation, unspecified: Secondary | ICD-10-CM | POA: Diagnosis not present

## 2016-09-17 DIAGNOSIS — N281 Cyst of kidney, acquired: Secondary | ICD-10-CM | POA: Diagnosis not present

## 2016-09-17 DIAGNOSIS — I129 Hypertensive chronic kidney disease with stage 1 through stage 4 chronic kidney disease, or unspecified chronic kidney disease: Secondary | ICD-10-CM | POA: Diagnosis present

## 2016-09-17 DIAGNOSIS — I252 Old myocardial infarction: Secondary | ICD-10-CM | POA: Diagnosis not present

## 2016-09-17 DIAGNOSIS — R1012 Left upper quadrant pain: Secondary | ICD-10-CM | POA: Diagnosis not present

## 2016-09-17 DIAGNOSIS — M1990 Unspecified osteoarthritis, unspecified site: Secondary | ICD-10-CM | POA: Diagnosis present

## 2016-09-17 DIAGNOSIS — K449 Diaphragmatic hernia without obstruction or gangrene: Secondary | ICD-10-CM | POA: Diagnosis present

## 2016-09-17 DIAGNOSIS — N184 Chronic kidney disease, stage 4 (severe): Secondary | ICD-10-CM | POA: Diagnosis not present

## 2016-09-17 DIAGNOSIS — D649 Anemia, unspecified: Secondary | ICD-10-CM | POA: Diagnosis not present

## 2016-09-17 DIAGNOSIS — D500 Iron deficiency anemia secondary to blood loss (chronic): Secondary | ICD-10-CM | POA: Diagnosis not present

## 2016-09-17 DIAGNOSIS — Z885 Allergy status to narcotic agent status: Secondary | ICD-10-CM | POA: Diagnosis not present

## 2016-09-17 DIAGNOSIS — E7800 Pure hypercholesterolemia, unspecified: Secondary | ICD-10-CM | POA: Diagnosis present

## 2016-09-17 DIAGNOSIS — D509 Iron deficiency anemia, unspecified: Secondary | ICD-10-CM | POA: Diagnosis not present

## 2016-09-17 DIAGNOSIS — Z91040 Latex allergy status: Secondary | ICD-10-CM | POA: Diagnosis not present

## 2016-09-17 DIAGNOSIS — I10 Essential (primary) hypertension: Secondary | ICD-10-CM | POA: Diagnosis not present

## 2016-09-17 DIAGNOSIS — F418 Other specified anxiety disorders: Secondary | ICD-10-CM | POA: Diagnosis present

## 2016-09-17 DIAGNOSIS — Z85038 Personal history of other malignant neoplasm of large intestine: Secondary | ICD-10-CM | POA: Diagnosis not present

## 2016-09-17 DIAGNOSIS — Z888 Allergy status to other drugs, medicaments and biological substances status: Secondary | ICD-10-CM | POA: Diagnosis not present

## 2016-09-17 DIAGNOSIS — K3189 Other diseases of stomach and duodenum: Secondary | ICD-10-CM | POA: Diagnosis not present

## 2016-09-17 DIAGNOSIS — J449 Chronic obstructive pulmonary disease, unspecified: Secondary | ICD-10-CM | POA: Diagnosis not present

## 2016-09-17 DIAGNOSIS — Z86711 Personal history of pulmonary embolism: Secondary | ICD-10-CM | POA: Diagnosis not present

## 2016-09-17 DIAGNOSIS — G20 Parkinson's disease: Secondary | ICD-10-CM | POA: Diagnosis not present

## 2016-09-17 DIAGNOSIS — K219 Gastro-esophageal reflux disease without esophagitis: Secondary | ICD-10-CM | POA: Diagnosis present

## 2016-09-17 DIAGNOSIS — K5790 Diverticulosis of intestine, part unspecified, without perforation or abscess without bleeding: Secondary | ICD-10-CM | POA: Diagnosis not present

## 2016-09-17 DIAGNOSIS — E538 Deficiency of other specified B group vitamins: Secondary | ICD-10-CM | POA: Diagnosis present

## 2016-09-25 DIAGNOSIS — E785 Hyperlipidemia, unspecified: Secondary | ICD-10-CM | POA: Diagnosis not present

## 2016-09-25 DIAGNOSIS — Z9181 History of falling: Secondary | ICD-10-CM | POA: Diagnosis not present

## 2016-09-25 DIAGNOSIS — I10 Essential (primary) hypertension: Secondary | ICD-10-CM | POA: Diagnosis not present

## 2016-09-25 DIAGNOSIS — Z09 Encounter for follow-up examination after completed treatment for conditions other than malignant neoplasm: Secondary | ICD-10-CM | POA: Diagnosis not present

## 2016-09-25 DIAGNOSIS — G8929 Other chronic pain: Secondary | ICD-10-CM | POA: Diagnosis not present

## 2016-09-25 DIAGNOSIS — M542 Cervicalgia: Secondary | ICD-10-CM | POA: Diagnosis not present

## 2016-09-26 DIAGNOSIS — D649 Anemia, unspecified: Secondary | ICD-10-CM | POA: Diagnosis not present

## 2016-10-18 DIAGNOSIS — D509 Iron deficiency anemia, unspecified: Secondary | ICD-10-CM | POA: Diagnosis not present

## 2016-10-18 DIAGNOSIS — K5790 Diverticulosis of intestine, part unspecified, without perforation or abscess without bleeding: Secondary | ICD-10-CM | POA: Diagnosis not present

## 2016-10-18 DIAGNOSIS — K219 Gastro-esophageal reflux disease without esophagitis: Secondary | ICD-10-CM | POA: Diagnosis not present

## 2016-10-18 DIAGNOSIS — D649 Anemia, unspecified: Secondary | ICD-10-CM | POA: Diagnosis not present

## 2016-11-29 DIAGNOSIS — D510 Vitamin B12 deficiency anemia due to intrinsic factor deficiency: Secondary | ICD-10-CM | POA: Diagnosis not present

## 2016-11-29 DIAGNOSIS — D649 Anemia, unspecified: Secondary | ICD-10-CM | POA: Diagnosis not present

## 2016-12-05 DIAGNOSIS — D649 Anemia, unspecified: Secondary | ICD-10-CM | POA: Diagnosis not present

## 2016-12-12 DIAGNOSIS — D649 Anemia, unspecified: Secondary | ICD-10-CM | POA: Diagnosis not present

## 2016-12-13 DIAGNOSIS — I2510 Atherosclerotic heart disease of native coronary artery without angina pectoris: Secondary | ICD-10-CM | POA: Diagnosis not present

## 2016-12-13 DIAGNOSIS — R52 Pain, unspecified: Secondary | ICD-10-CM | POA: Diagnosis not present

## 2016-12-13 DIAGNOSIS — Z23 Encounter for immunization: Secondary | ICD-10-CM | POA: Diagnosis not present

## 2016-12-13 DIAGNOSIS — F419 Anxiety disorder, unspecified: Secondary | ICD-10-CM | POA: Diagnosis not present

## 2016-12-19 DIAGNOSIS — D649 Anemia, unspecified: Secondary | ICD-10-CM | POA: Diagnosis not present

## 2017-01-10 DIAGNOSIS — Z0000 Encounter for general adult medical examination without abnormal findings: Secondary | ICD-10-CM | POA: Diagnosis not present

## 2017-01-10 DIAGNOSIS — E559 Vitamin D deficiency, unspecified: Secondary | ICD-10-CM | POA: Diagnosis not present

## 2017-01-10 DIAGNOSIS — Z79899 Other long term (current) drug therapy: Secondary | ICD-10-CM | POA: Diagnosis not present

## 2017-01-10 DIAGNOSIS — D510 Vitamin B12 deficiency anemia due to intrinsic factor deficiency: Secondary | ICD-10-CM | POA: Diagnosis not present

## 2017-01-10 DIAGNOSIS — I10 Essential (primary) hypertension: Secondary | ICD-10-CM | POA: Diagnosis not present

## 2017-01-15 DIAGNOSIS — D649 Anemia, unspecified: Secondary | ICD-10-CM | POA: Diagnosis not present

## 2017-01-21 DIAGNOSIS — K5790 Diverticulosis of intestine, part unspecified, without perforation or abscess without bleeding: Secondary | ICD-10-CM | POA: Diagnosis not present

## 2017-01-21 DIAGNOSIS — D509 Iron deficiency anemia, unspecified: Secondary | ICD-10-CM | POA: Diagnosis not present

## 2017-01-21 DIAGNOSIS — K219 Gastro-esophageal reflux disease without esophagitis: Secondary | ICD-10-CM | POA: Diagnosis not present

## 2017-01-29 DIAGNOSIS — E538 Deficiency of other specified B group vitamins: Secondary | ICD-10-CM | POA: Diagnosis not present

## 2017-01-29 DIAGNOSIS — D649 Anemia, unspecified: Secondary | ICD-10-CM | POA: Diagnosis not present

## 2017-02-04 DIAGNOSIS — J449 Chronic obstructive pulmonary disease, unspecified: Secondary | ICD-10-CM | POA: Diagnosis not present

## 2017-02-04 DIAGNOSIS — J209 Acute bronchitis, unspecified: Secondary | ICD-10-CM | POA: Diagnosis not present

## 2017-02-12 DIAGNOSIS — Z79899 Other long term (current) drug therapy: Secondary | ICD-10-CM | POA: Diagnosis not present

## 2017-02-12 DIAGNOSIS — F419 Anxiety disorder, unspecified: Secondary | ICD-10-CM | POA: Diagnosis not present

## 2017-02-12 DIAGNOSIS — R52 Pain, unspecified: Secondary | ICD-10-CM | POA: Diagnosis not present

## 2017-02-15 DIAGNOSIS — N289 Disorder of kidney and ureter, unspecified: Secondary | ICD-10-CM | POA: Diagnosis not present

## 2017-02-15 DIAGNOSIS — D649 Anemia, unspecified: Secondary | ICD-10-CM | POA: Diagnosis not present

## 2017-02-27 DIAGNOSIS — M549 Dorsalgia, unspecified: Secondary | ICD-10-CM | POA: Diagnosis not present

## 2017-02-27 DIAGNOSIS — M542 Cervicalgia: Secondary | ICD-10-CM | POA: Diagnosis not present

## 2017-02-27 DIAGNOSIS — M545 Low back pain: Secondary | ICD-10-CM | POA: Diagnosis not present

## 2017-03-04 DIAGNOSIS — M545 Low back pain: Secondary | ICD-10-CM | POA: Diagnosis not present

## 2017-03-04 DIAGNOSIS — M542 Cervicalgia: Secondary | ICD-10-CM | POA: Diagnosis not present

## 2017-03-08 DIAGNOSIS — M545 Low back pain: Secondary | ICD-10-CM | POA: Diagnosis not present

## 2017-03-08 DIAGNOSIS — M542 Cervicalgia: Secondary | ICD-10-CM | POA: Diagnosis not present

## 2017-03-14 DIAGNOSIS — G894 Chronic pain syndrome: Secondary | ICD-10-CM | POA: Diagnosis not present

## 2017-03-18 DIAGNOSIS — M4802 Spinal stenosis, cervical region: Secondary | ICD-10-CM | POA: Diagnosis not present

## 2017-03-18 DIAGNOSIS — M47812 Spondylosis without myelopathy or radiculopathy, cervical region: Secondary | ICD-10-CM | POA: Diagnosis not present

## 2017-03-18 DIAGNOSIS — S199XXA Unspecified injury of neck, initial encounter: Secondary | ICD-10-CM | POA: Diagnosis not present

## 2017-03-18 DIAGNOSIS — I700 Atherosclerosis of aorta: Secondary | ICD-10-CM | POA: Diagnosis not present

## 2017-03-18 DIAGNOSIS — M542 Cervicalgia: Secondary | ICD-10-CM | POA: Diagnosis not present

## 2017-03-21 DIAGNOSIS — M545 Low back pain: Secondary | ICD-10-CM | POA: Diagnosis not present

## 2017-03-21 DIAGNOSIS — M5412 Radiculopathy, cervical region: Secondary | ICD-10-CM | POA: Diagnosis not present

## 2017-03-21 DIAGNOSIS — M5416 Radiculopathy, lumbar region: Secondary | ICD-10-CM | POA: Diagnosis not present

## 2017-03-28 DIAGNOSIS — M48061 Spinal stenosis, lumbar region without neurogenic claudication: Secondary | ICD-10-CM | POA: Diagnosis not present

## 2017-03-28 DIAGNOSIS — M5416 Radiculopathy, lumbar region: Secondary | ICD-10-CM | POA: Diagnosis not present

## 2017-04-29 DIAGNOSIS — N400 Enlarged prostate without lower urinary tract symptoms: Secondary | ICD-10-CM | POA: Diagnosis not present

## 2017-04-29 DIAGNOSIS — R300 Dysuria: Secondary | ICD-10-CM | POA: Diagnosis not present

## 2017-04-29 DIAGNOSIS — K5900 Constipation, unspecified: Secondary | ICD-10-CM | POA: Diagnosis not present

## 2017-05-02 DIAGNOSIS — M5416 Radiculopathy, lumbar region: Secondary | ICD-10-CM | POA: Diagnosis not present

## 2017-05-02 DIAGNOSIS — M5412 Radiculopathy, cervical region: Secondary | ICD-10-CM | POA: Diagnosis not present

## 2017-05-02 DIAGNOSIS — M545 Low back pain: Secondary | ICD-10-CM | POA: Diagnosis not present

## 2017-05-02 DIAGNOSIS — M542 Cervicalgia: Secondary | ICD-10-CM | POA: Diagnosis not present

## 2017-05-15 DIAGNOSIS — D649 Anemia, unspecified: Secondary | ICD-10-CM | POA: Diagnosis not present

## 2017-05-15 DIAGNOSIS — N189 Chronic kidney disease, unspecified: Secondary | ICD-10-CM | POA: Diagnosis not present

## 2017-05-15 DIAGNOSIS — N289 Disorder of kidney and ureter, unspecified: Secondary | ICD-10-CM | POA: Diagnosis not present

## 2017-05-22 DIAGNOSIS — K5790 Diverticulosis of intestine, part unspecified, without perforation or abscess without bleeding: Secondary | ICD-10-CM | POA: Diagnosis not present

## 2017-05-22 DIAGNOSIS — K219 Gastro-esophageal reflux disease without esophagitis: Secondary | ICD-10-CM | POA: Diagnosis not present

## 2017-05-22 DIAGNOSIS — D509 Iron deficiency anemia, unspecified: Secondary | ICD-10-CM | POA: Diagnosis not present

## 2017-06-06 DIAGNOSIS — G20 Parkinson's disease: Secondary | ICD-10-CM | POA: Diagnosis not present

## 2017-06-06 DIAGNOSIS — Z6828 Body mass index (BMI) 28.0-28.9, adult: Secondary | ICD-10-CM | POA: Diagnosis not present

## 2017-06-06 DIAGNOSIS — E785 Hyperlipidemia, unspecified: Secondary | ICD-10-CM | POA: Diagnosis not present

## 2017-06-06 DIAGNOSIS — J449 Chronic obstructive pulmonary disease, unspecified: Secondary | ICD-10-CM | POA: Diagnosis not present

## 2017-06-20 DIAGNOSIS — E785 Hyperlipidemia, unspecified: Secondary | ICD-10-CM | POA: Diagnosis not present

## 2017-06-20 DIAGNOSIS — Z130 Encounter for screening for diseases of the blood and blood-forming organs and certain disorders involving the immune mechanism: Secondary | ICD-10-CM | POA: Diagnosis not present

## 2017-06-20 DIAGNOSIS — Z1329 Encounter for screening for other suspected endocrine disorder: Secondary | ICD-10-CM | POA: Diagnosis not present

## 2017-06-27 DIAGNOSIS — Z0000 Encounter for general adult medical examination without abnormal findings: Secondary | ICD-10-CM | POA: Diagnosis not present

## 2017-06-27 DIAGNOSIS — D509 Iron deficiency anemia, unspecified: Secondary | ICD-10-CM | POA: Diagnosis not present

## 2017-06-27 DIAGNOSIS — J449 Chronic obstructive pulmonary disease, unspecified: Secondary | ICD-10-CM | POA: Diagnosis not present

## 2017-06-27 DIAGNOSIS — E785 Hyperlipidemia, unspecified: Secondary | ICD-10-CM | POA: Diagnosis not present

## 2017-06-27 DIAGNOSIS — Z139 Encounter for screening, unspecified: Secondary | ICD-10-CM | POA: Diagnosis not present

## 2017-06-27 DIAGNOSIS — Z1331 Encounter for screening for depression: Secondary | ICD-10-CM | POA: Diagnosis not present

## 2017-06-27 DIAGNOSIS — Z9181 History of falling: Secondary | ICD-10-CM | POA: Diagnosis not present

## 2017-06-27 DIAGNOSIS — N189 Chronic kidney disease, unspecified: Secondary | ICD-10-CM | POA: Diagnosis not present

## 2017-07-03 DIAGNOSIS — G894 Chronic pain syndrome: Secondary | ICD-10-CM | POA: Diagnosis not present

## 2017-07-25 DIAGNOSIS — G20 Parkinson's disease: Secondary | ICD-10-CM | POA: Diagnosis not present

## 2017-07-25 DIAGNOSIS — F0280 Dementia in other diseases classified elsewhere without behavioral disturbance: Secondary | ICD-10-CM | POA: Diagnosis not present

## 2017-07-31 DIAGNOSIS — G894 Chronic pain syndrome: Secondary | ICD-10-CM | POA: Diagnosis not present

## 2017-07-31 DIAGNOSIS — F419 Anxiety disorder, unspecified: Secondary | ICD-10-CM | POA: Diagnosis not present

## 2017-08-02 DIAGNOSIS — M5412 Radiculopathy, cervical region: Secondary | ICD-10-CM | POA: Diagnosis not present

## 2017-08-02 DIAGNOSIS — M542 Cervicalgia: Secondary | ICD-10-CM | POA: Diagnosis not present

## 2017-08-19 DIAGNOSIS — D509 Iron deficiency anemia, unspecified: Secondary | ICD-10-CM | POA: Diagnosis not present

## 2017-08-19 DIAGNOSIS — K5790 Diverticulosis of intestine, part unspecified, without perforation or abscess without bleeding: Secondary | ICD-10-CM | POA: Diagnosis not present

## 2017-08-19 DIAGNOSIS — K5900 Constipation, unspecified: Secondary | ICD-10-CM | POA: Diagnosis not present

## 2017-08-28 DIAGNOSIS — Z9181 History of falling: Secondary | ICD-10-CM | POA: Diagnosis not present

## 2017-08-28 DIAGNOSIS — G8929 Other chronic pain: Secondary | ICD-10-CM | POA: Diagnosis not present

## 2017-08-28 DIAGNOSIS — N400 Enlarged prostate without lower urinary tract symptoms: Secondary | ICD-10-CM | POA: Diagnosis not present

## 2017-08-28 DIAGNOSIS — M542 Cervicalgia: Secondary | ICD-10-CM | POA: Diagnosis not present

## 2017-09-08 DIAGNOSIS — Z9181 History of falling: Secondary | ICD-10-CM | POA: Diagnosis not present

## 2017-09-08 DIAGNOSIS — J449 Chronic obstructive pulmonary disease, unspecified: Secondary | ICD-10-CM | POA: Diagnosis not present

## 2017-09-08 DIAGNOSIS — E785 Hyperlipidemia, unspecified: Secondary | ICD-10-CM | POA: Diagnosis not present

## 2017-09-08 DIAGNOSIS — N189 Chronic kidney disease, unspecified: Secondary | ICD-10-CM | POA: Diagnosis not present

## 2017-09-26 DIAGNOSIS — D649 Anemia, unspecified: Secondary | ICD-10-CM | POA: Diagnosis not present

## 2017-09-26 DIAGNOSIS — D509 Iron deficiency anemia, unspecified: Secondary | ICD-10-CM | POA: Diagnosis not present

## 2017-09-26 DIAGNOSIS — E785 Hyperlipidemia, unspecified: Secondary | ICD-10-CM | POA: Diagnosis not present

## 2017-10-03 DIAGNOSIS — J449 Chronic obstructive pulmonary disease, unspecified: Secondary | ICD-10-CM | POA: Diagnosis not present

## 2017-10-03 DIAGNOSIS — Z139 Encounter for screening, unspecified: Secondary | ICD-10-CM | POA: Diagnosis not present

## 2017-10-03 DIAGNOSIS — Z9181 History of falling: Secondary | ICD-10-CM | POA: Diagnosis not present

## 2017-10-03 DIAGNOSIS — Z1331 Encounter for screening for depression: Secondary | ICD-10-CM | POA: Diagnosis not present

## 2017-10-03 DIAGNOSIS — D649 Anemia, unspecified: Secondary | ICD-10-CM | POA: Diagnosis not present

## 2017-10-03 DIAGNOSIS — Z0000 Encounter for general adult medical examination without abnormal findings: Secondary | ICD-10-CM | POA: Diagnosis not present

## 2017-10-03 DIAGNOSIS — G20 Parkinson's disease: Secondary | ICD-10-CM | POA: Diagnosis not present

## 2017-10-03 DIAGNOSIS — E785 Hyperlipidemia, unspecified: Secondary | ICD-10-CM | POA: Diagnosis not present

## 2017-10-09 DIAGNOSIS — Z79899 Other long term (current) drug therapy: Secondary | ICD-10-CM

## 2017-10-09 DIAGNOSIS — E785 Hyperlipidemia, unspecified: Secondary | ICD-10-CM | POA: Diagnosis not present

## 2017-10-09 DIAGNOSIS — Z9181 History of falling: Secondary | ICD-10-CM | POA: Diagnosis not present

## 2017-10-09 DIAGNOSIS — D631 Anemia in chronic kidney disease: Secondary | ICD-10-CM | POA: Diagnosis not present

## 2017-10-09 DIAGNOSIS — N189 Chronic kidney disease, unspecified: Secondary | ICD-10-CM | POA: Diagnosis not present

## 2017-10-09 DIAGNOSIS — D6489 Other specified anemias: Secondary | ICD-10-CM

## 2017-10-09 DIAGNOSIS — N289 Disorder of kidney and ureter, unspecified: Secondary | ICD-10-CM

## 2017-10-09 DIAGNOSIS — G20 Parkinson's disease: Secondary | ICD-10-CM | POA: Diagnosis not present

## 2017-10-09 DIAGNOSIS — J449 Chronic obstructive pulmonary disease, unspecified: Secondary | ICD-10-CM | POA: Diagnosis not present

## 2017-11-08 DIAGNOSIS — J449 Chronic obstructive pulmonary disease, unspecified: Secondary | ICD-10-CM | POA: Diagnosis not present

## 2017-11-08 DIAGNOSIS — N189 Chronic kidney disease, unspecified: Secondary | ICD-10-CM | POA: Diagnosis not present

## 2017-11-08 DIAGNOSIS — G20 Parkinson's disease: Secondary | ICD-10-CM | POA: Diagnosis not present

## 2017-11-08 DIAGNOSIS — E785 Hyperlipidemia, unspecified: Secondary | ICD-10-CM | POA: Diagnosis not present

## 2017-12-03 DIAGNOSIS — M5412 Radiculopathy, cervical region: Secondary | ICD-10-CM | POA: Diagnosis not present

## 2017-12-03 DIAGNOSIS — M542 Cervicalgia: Secondary | ICD-10-CM | POA: Diagnosis not present

## 2017-12-09 DIAGNOSIS — J449 Chronic obstructive pulmonary disease, unspecified: Secondary | ICD-10-CM | POA: Diagnosis not present

## 2017-12-09 DIAGNOSIS — G20 Parkinson's disease: Secondary | ICD-10-CM | POA: Diagnosis not present

## 2017-12-09 DIAGNOSIS — E785 Hyperlipidemia, unspecified: Secondary | ICD-10-CM | POA: Diagnosis not present

## 2017-12-09 DIAGNOSIS — N189 Chronic kidney disease, unspecified: Secondary | ICD-10-CM | POA: Diagnosis not present

## 2017-12-12 ENCOUNTER — Ambulatory Visit: Payer: Medicare Other | Admitting: Neurology

## 2017-12-13 ENCOUNTER — Encounter: Payer: Self-pay | Admitting: Neurology

## 2017-12-18 DIAGNOSIS — K210 Gastro-esophageal reflux disease with esophagitis: Secondary | ICD-10-CM | POA: Diagnosis not present

## 2017-12-18 DIAGNOSIS — D689 Coagulation defect, unspecified: Secondary | ICD-10-CM | POA: Diagnosis not present

## 2017-12-18 DIAGNOSIS — Z23 Encounter for immunization: Secondary | ICD-10-CM | POA: Diagnosis not present

## 2017-12-18 DIAGNOSIS — E785 Hyperlipidemia, unspecified: Secondary | ICD-10-CM | POA: Diagnosis not present

## 2017-12-18 DIAGNOSIS — Z6829 Body mass index (BMI) 29.0-29.9, adult: Secondary | ICD-10-CM | POA: Diagnosis not present

## 2018-01-02 DIAGNOSIS — H26493 Other secondary cataract, bilateral: Secondary | ICD-10-CM | POA: Diagnosis not present

## 2018-01-14 ENCOUNTER — Telehealth: Payer: Self-pay | Admitting: Neurology

## 2018-01-14 ENCOUNTER — Encounter: Payer: Self-pay | Admitting: Neurology

## 2018-01-14 ENCOUNTER — Ambulatory Visit (INDEPENDENT_AMBULATORY_CARE_PROVIDER_SITE_OTHER): Payer: Medicare Other | Admitting: Neurology

## 2018-01-14 VITALS — BP 95/62 | HR 90 | Ht 68.0 in | Wt 191.0 lb

## 2018-01-14 DIAGNOSIS — G20 Parkinson's disease: Secondary | ICD-10-CM

## 2018-01-14 DIAGNOSIS — R413 Other amnesia: Secondary | ICD-10-CM

## 2018-01-14 DIAGNOSIS — Z9181 History of falling: Secondary | ICD-10-CM

## 2018-01-14 MED ORDER — CARBIDOPA-LEVODOPA 25-250 MG PO TABS: 2.0000 | ORAL_TABLET | Freq: Four times a day (QID) | ORAL | 3 refills | Status: AC

## 2018-01-14 NOTE — Patient Instructions (Addendum)
Your blood pressure is low. This may be due to not drinking enough water, taking a higher dose of levodopa at once (250 mg) and due to underlying parkinsonism.  I would recommend the following: we will change your Sinemet to 25/100 mg strength, 2 pills 4 times a day, at 8 AM, 12, 4 PM and 8 PM.  We will stop the 25/250 mg. You can continue with the ropinirole at 0.25 mg three times a day through Dr. Nyra Capes.  I would be very cautious with Valium and narcotic pain medication due to fall risk.  We will monitor your memory.   Given the fact that you have parkinson's disease, which has affected your posture, your memory and your fine motor control and due to the fact that you take sedating medications including valium and narcotic pain medication, I recommend, that you no longer drive.

## 2018-01-14 NOTE — Telephone Encounter (Signed)
Patient states she will not be returning to our office so she doesn't need her follow-up requested by MD. Patient gave no reason.

## 2018-01-14 NOTE — Telephone Encounter (Signed)
Noted, thanks!

## 2018-01-14 NOTE — Progress Notes (Addendum)
Subjective:    Patient ID: Riley Ford is a 83 y.o. male.  HPI     Star Age, MD, PhD [facility] [address]O. [place], [place] [address]  Dear Riley Ford,   I saw your patient, Riley Ford, upon your kind request, in my neurologic clinic today for initial consultation of his prior diagnosis of parkinsonism/Parkinson's disease. The patient is accompanied by his daughter today. Of note, he no showed for an appointment on 12/12/2017. As you know, [name] is an 83 year old right-handed gentleman with an underlying medical history of chronic kidney disease, COPD, iron deficiency anemia, vitamin D deficiency, hyperlipidemia, vitamin B12 deficiency, history of DVT, on Xarelto, chronic pain, on chronic narcotic pain medication, anxiety, and overweight state, who reports a history of PD, with Sx dating back to about 8 years ago. He initially had gait instability and a right hand tremor. Over time his tremor has become worse. He uses a cane. He has been on the current dose of Sinemet generic for some time, currently at 25-250 milligrams strength one pill 3 times a day, at 8:00 or 9 AM, 3 PM and 8 or 9 PM. He has tried it 4 times a day but became lethargic according to the daughter, also had involuntary movements, likely dyskinesias. He also had mood irritability. He reports feeling sleepy during the day. His blood pressure has been lower. He also takes Requip low dose, 0.25 mg strength 3 times a day at the same times as the levodopa. He has a good appetite but does not drink enough water he admits. He estimates that he drinks about 2 bottles of water per day, 16 ounces each. He drives during the day, locally. Lives with his 2 daughters. He does not smoke or utilize alcohol and does not drink caffeine. I reviewed your office note from 10/03/2017, which you kindly included. For his Parkinson's disease he was previously seen by Dr. Sabra Heck in [facility] since  about 2012, last seen in 2017. A brain MRI was reportedly normal in 2016.  He is on norco about 2 per day and Valium 10 mg about 2-3 per day. He sees Dr. Jimmye Norman in [place] for pain management. He has had memory loss, which per daughter has become worse, has gotten lost while driving once, which was over a year ago. He denies hallucinations.  He also saw Dr. Macario Carls, neurologist, on 07/25/17 out of [facility], but did not go back for FU. He reports that she told him, he should not be left alone. I reviewed the note. She recommended, that he not take both Valium and Norco for increase risk of overdose. She did not change the C/L, but increased the Requip from qd to tid.  His Past Medical History Is Significant For: Past Medical History:  Diagnosis Date  . Anxiety   . Cancer ([place])   . Chronic kidney disease   . Chronic pain   . DVT (deep venous thrombosis) ([place])   . Hypertension   . Impaired mobility   . Parkinsons disease ([place])   . Risk for falls   . Systemic lupus erythematosus ([place])   . Varicose veins   . Vitamin B 12 deficiency      His Past Surgical History Is Significant For:   His Family History Is Significant For: Family History  Problem Relation Age of Onset  . Hypertension Mother   . Heart disease Mother   . Cancer Father   . Heart disease  Father   . Hypertension Father   . Diabetes Sister   . Hypertension Sister     His Social History Is Significant For: Social History   Socioeconomic History  . Marital status: Single    Spouse name: Not on file  . Number of children: Not on file  . Years of education: Not on file  . Highest education level: Not on file  Occupational History  . Not on file  Social Needs  . Financial resource strain: Not on file  . Food insecurity:    Worry: Not on file    Inability: Not on file  . Transportation needs:    Medical: Not on file    Non-medical: Not on file  Tobacco Use  . Smoking status: Former Smoker    Years: 25.00     Types: Cigarettes    Last attempt to quit: 07/27/1986    Years since quitting: 31.4  . Smokeless tobacco: Former Systems developer    Quit date: 08/15/1973  Substance and Sexual Activity  . Alcohol use: No    Alcohol/week: 0.0 standard drinks  . Drug use: No  . Sexual activity: Not on file  Lifestyle  . Physical activity:    Days per week: Not on file    Minutes per session: Not on file  . Stress: Not on file  Relationships  . Social connections:    Talks on phone: Not on file    Gets together: Not on file    Attends religious service: Not on file    Active member of club or organization: Not on file    Attends meetings of clubs or organizations: Not on file    Relationship status: Not on file  Other Topics Concern  . Not on file  Social History Narrative  . Not on file    His Allergies Are:  Allergies  Allergen Reactions  . Codeine Hives and Rash    Pt states he can tolerate codeine if he takes Benadryl with it.  jkl  . Ibuprofen Other (See Comments)    Kidney Disease  . Latex Rash  . Naproxen Other (See Comments)    Kidney Disease  :   His Current Medications Are:  Outpatient Encounter Medications as of 01/14/2018  Medication Sig  . ALFUZOSIN HCL ER PO Take 10 mg by mouth daily.  Marland Kitchen aspirin 81 MG chewable tablet Chew 81 mg by mouth daily.  Marland Kitchen atorvastatin (LIPITOR) 40 MG tablet Take 40 mg by mouth daily.  Marland Kitchen BENAZEPRIL HCL PO Take 5 mg by mouth daily.  . carbidopa-levodopa (SINEMET IR) 25-250 MG tablet Take 2 tablets by mouth 4 (four) times daily.  . diazepam (VALIUM) 10 MG tablet Take 10 mg by mouth every 8 (eight) hours as needed for anxiety.  . ferrous sulfate 325 (65 FE) MG tablet Take 325 mg by mouth daily with breakfast.  . HYDROcodone-acetaminophen (NORCO) 10-325 MG per tablet Take 1 tablet by mouth every 6 (six) hours as needed.  . IMIPRAMINE HCL PO Take 25 mg by mouth daily.  Marland Kitchen ketorolac (ACULAR LS) 0.4 % SOLN 1 drop 4 (four) times daily.  Marland Kitchen omeprazole (PRILOSEC) 40  MG capsule Take 40 mg by mouth daily.  . Potassium Chloride ER 20 MEQ TBCR Take 20 mEq by mouth daily.  . rivaroxaban (XARELTO) 20 MG TABS tablet Take 20 mg by mouth daily with supper.  . rizatriptan (MAXALT) 10 MG tablet Take 10 mg by mouth as needed for migraine. May repeat in  2 hours if needed  . rOPINIRole (REQUIP) 0.25 MG tablet Take 0.25 mg by mouth 3 (three) times daily.  . [DISCONTINUED] carbidopa-levodopa (SINEMET IR) 25-250 MG per tablet Take 1 tablet by mouth 4 (four) times daily.  . [DISCONTINUED] Albuterol Sulfate 108 (90 BASE) MCG/ACT AEPB Inhale into the lungs as directed.  . [DISCONTINUED] Azelastine-Fluticasone 137-50 MCG/ACT SUSP Place into the nose as directed.  . [DISCONTINUED] Fluticasone Furoate-Vilanterol 200-25 MCG/INH AEPB Inhale into the lungs as directed.  . [DISCONTINUED] GUAIFENESIN ER PO Take 600 mg by mouth 2 (two) times daily.  . [DISCONTINUED] moxifloxacin (VIGAMOX) 0.5 % ophthalmic solution 1 drop.  . [DISCONTINUED] niacin 500 MG tablet Take 500 mg by mouth at bedtime.  . [DISCONTINUED] prednisoLONE acetate (PRED FORTE) 1 % ophthalmic suspension 1 drop every 2 (two) hours while awake.  . [DISCONTINUED] promethazine (PHENERGAN) 25 MG tablet Take 25 mg by mouth 3 (three) times daily as needed for nausea or vomiting.  . [DISCONTINUED] ranitidine (ZANTAC) 150 MG tablet Take 150 mg by mouth 2 (two) times daily.  . [DISCONTINUED] Vitamin D, Ergocalciferol, (DRISDOL) 50000 UNITS CAPS capsule Take 50,000 Units by mouth every 7 (seven) days.   No facility-administered encounter medications on file as of 01/14/2018.   : Review of Systems:  Out of a complete 14 point review of systems, all are reviewed and negative with the exception of these symptoms as listed below:  Review of Systems  Neurological:       Pt presents today to establish care with a new neurologist for his PD. Pt is also concerned about his memory.     Objective:  Neurological Exam  Physical  Exam Physical Examination:   Vitals:   01/14/18 1000  BP: 95/62  Pulse: 90   General Examination: The patient is a very pleasant 83 y.o. male in no acute distress. He appears well-developed and well-nourished and well groomed.   HEENT: Normocephalic, atraumatic, pupils are equal, round and reactive to light and accommodation.Status post bilateral cataract repairs. He has bifocal corrective eyeglasses but needs new glasses I believe. He reports that he has recently had an eye examination and needs to see another specialist. Extraocular tracking is mildly impaired. He has very mild facial masking. Oropharynx exam reveals: moderate mouth dryness, edentulous state, no oral facial dyskinesias, tongue protrudes centrally and palate elevates symmetrically, mild hearing loss noted, mild hypophonia, no lip, neck or jaw tremor. He does have moderate nuchal rigidity.  Chest: Clear to auscultation without wheezing, rhonchi or crackles noted.  Heart: S1+S2+0, regular and normal without murmurs, rubs or gallops noted.   Abdomen: Soft, non-tender and non-distended with normal bowel sounds appreciated on auscultation.  Extremities: There is no pitting edema in the distal lower extremities bilaterally.   Skin: Warm and dry without trophic changes noted.  Musculoskeletal: exam reveals no obvious joint deformities, tenderness or joint swelling or erythema.   Neurologically:  Mental status: The patient is awake, Paying good attention, oriented to situation, self, location. He does have impaired immediate memory and recall. He is not able to give a very detailed or elaborate history.he has word finding difficulty. There is some slowness in thinking. Thought process is linear. Mood is normal and affect is normal.  Cranial nerves II - XII are as described above under HEENT exam. In addition: shoulder shrug is normal with equal shoulder height noted. Motor exam: Normal bulk, normal strength for age, he has a  mildly increased tone in the right upper extremity, slight increase in  tone in the left upper extremity. He has a fairly consistent resting tremor in the mild range in the right upper extremity only, no other resting tremor is noted.   On 01/14/2018: on Archimedes spiral drawing he has no significant trembling with the right hand which is the dominant hand, minimal trembling with the left hand. Handwriting is tremulous, legible, not particularly micrographic. He has an intermittent postural tremor in the right hand only. On fine motor skills and coordination testing: He has moderate impairment of finger taps, hand movements and rapid alternating patting throughout, slightly worse perhaps on the right. Reflexes are 1+, absent in the ankles. Cerebellar testing: No dysmetria or intention tremor, no evidence of cerebellar ataxia. Sensory exam: intact to light touch in the upper and lower extremities.  Gait, station and balance: He stands with difficultyand has to push himself up. Posture is moderately stooped. He has decreased stride length and pace, slight difficulty turning, decreased arm swing on the right. Balance is mildly impaired.  Assessment and Plan:    In summary, Kao Conry is a very pleasant 83 y.o.-year old male with an underlying medical history of chronic kidney disease, COPD, iron deficiency anemia, vitamin D deficiency, hyperlipidemia, vitamin B12 deficiency, history of DVT, on Xarelto, chronic pain, on chronic narcotic pain medication, anxiety, and overweight state, who presents for evaluation of his parkinsonism with his history and physical exam in keeping with right-sided predominant Parkinson's disease. Symptoms date back to about 8 years ago per daughter's report. He has been levodopa therapy, currently on a high individual dose of 250 mg of levodopa, 3 times a day. Given his history of dyskinesias and low blood pressure values, as well as complaint of daytime somnolence I feel that  the individual dose of 250 mg each time is probably too much for him. I would recommend that he switch to Sinemet generic 25-100 milligrams strength and take 2 pills 4 times a day for a total dose of 800 mg which is a little bit more than his current total dose of 750 mg. He can continue with ropinirole low-dose 3 times a day. We will do MMSE next time but he does have evidence of associated memory loss. He has a history of mood irritability and short-term memory loss which has been progressive per daughter. He got lost driving once about a year ago. Given his postural changes, low blood pressure, fine motor dyscontrol and the fact that he is on potentially sedating medications including Valium and narcotic pain medication I advised patient to no longer drives. He was not happy with this recommendation and his daughter was adamant that will need to continue to drive; she was not in agreement with my medical recommendation. I would recommend extreme caution with the use of benzodiazepine medication as well as narcotic pain medication as he is already at fall risk secondary to Parkinson's disease and his advanced age. He would be willing to try the Sinemet 25/100 mg 2 pills qid, and I recommended she take it at 8 AM, 12, 4 PM and 8 PM.he was advised to stop Sinemet 25-250 milligram strength, once he has the new Rx.  We talked about the importance of good hydration and healthy lifestyle. He may need to start using a walker. He is advised to increase his water intake. I suggested a 3 month follow-up, sooner if needed. I answered all their questions today and provided written instructions and a new prescription for Sinemet. Thank you very much for allowing me to  participate in the care of this nice patient. If I can be of any further assistance to you please do not hesitate to call me at [phone].  Sincerely,   Star Age, MD, PhD

## 2018-01-29 DIAGNOSIS — D689 Coagulation defect, unspecified: Secondary | ICD-10-CM | POA: Diagnosis not present

## 2018-01-29 DIAGNOSIS — G20 Parkinson's disease: Secondary | ICD-10-CM | POA: Diagnosis not present

## 2018-01-29 DIAGNOSIS — Z6829 Body mass index (BMI) 29.0-29.9, adult: Secondary | ICD-10-CM | POA: Diagnosis not present

## 2018-01-29 DIAGNOSIS — G2581 Restless legs syndrome: Secondary | ICD-10-CM | POA: Diagnosis not present

## 2018-02-03 DIAGNOSIS — N400 Enlarged prostate without lower urinary tract symptoms: Secondary | ICD-10-CM | POA: Diagnosis not present

## 2018-02-03 DIAGNOSIS — E785 Hyperlipidemia, unspecified: Secondary | ICD-10-CM | POA: Diagnosis not present

## 2018-03-14 DIAGNOSIS — M542 Cervicalgia: Secondary | ICD-10-CM | POA: Diagnosis not present

## 2018-04-01 DIAGNOSIS — M542 Cervicalgia: Secondary | ICD-10-CM | POA: Diagnosis not present

## 2018-04-01 DIAGNOSIS — M5412 Radiculopathy, cervical region: Secondary | ICD-10-CM | POA: Diagnosis not present

## 2018-04-11 DIAGNOSIS — D631 Anemia in chronic kidney disease: Secondary | ICD-10-CM | POA: Diagnosis not present

## 2018-04-11 DIAGNOSIS — N289 Disorder of kidney and ureter, unspecified: Secondary | ICD-10-CM | POA: Diagnosis not present

## 2018-04-11 DIAGNOSIS — N189 Chronic kidney disease, unspecified: Secondary | ICD-10-CM | POA: Diagnosis not present

## 2018-04-11 DIAGNOSIS — D6489 Other specified anemias: Secondary | ICD-10-CM | POA: Diagnosis not present

## 2018-04-24 DIAGNOSIS — D509 Iron deficiency anemia, unspecified: Secondary | ICD-10-CM | POA: Diagnosis not present

## 2018-04-26 DIAGNOSIS — J411 Mucopurulent chronic bronchitis: Secondary | ICD-10-CM | POA: Diagnosis not present

## 2018-05-01 DIAGNOSIS — D509 Iron deficiency anemia, unspecified: Secondary | ICD-10-CM | POA: Diagnosis not present

## 2018-05-05 DIAGNOSIS — Z6829 Body mass index (BMI) 29.0-29.9, adult: Secondary | ICD-10-CM | POA: Diagnosis not present

## 2018-05-05 DIAGNOSIS — J209 Acute bronchitis, unspecified: Secondary | ICD-10-CM | POA: Diagnosis not present

## 2018-05-05 DIAGNOSIS — Z9189 Other specified personal risk factors, not elsewhere classified: Secondary | ICD-10-CM | POA: Diagnosis not present

## 2018-05-28 ENCOUNTER — Other Ambulatory Visit: Payer: Self-pay | Admitting: Sports Medicine

## 2018-05-28 ENCOUNTER — Other Ambulatory Visit: Payer: Self-pay

## 2018-05-28 ENCOUNTER — Ambulatory Visit (INDEPENDENT_AMBULATORY_CARE_PROVIDER_SITE_OTHER): Payer: Medicare Other

## 2018-05-28 ENCOUNTER — Ambulatory Visit (INDEPENDENT_AMBULATORY_CARE_PROVIDER_SITE_OTHER): Payer: Medicare Other | Admitting: Sports Medicine

## 2018-05-28 ENCOUNTER — Encounter: Payer: Self-pay | Admitting: Sports Medicine

## 2018-05-28 VITALS — BP 114/71 | HR 77 | Resp 16

## 2018-05-28 DIAGNOSIS — M2042 Other hammer toe(s) (acquired), left foot: Secondary | ICD-10-CM

## 2018-05-28 DIAGNOSIS — M79672 Pain in left foot: Principal | ICD-10-CM

## 2018-05-28 DIAGNOSIS — R269 Unspecified abnormalities of gait and mobility: Secondary | ICD-10-CM

## 2018-05-28 DIAGNOSIS — M79671 Pain in right foot: Secondary | ICD-10-CM

## 2018-05-28 DIAGNOSIS — G20 Parkinson's disease: Secondary | ICD-10-CM

## 2018-05-28 MED ORDER — LIDOCAINE 5 % EX OINT: 1.0000 "application " | TOPICAL_OINTMENT | CUTANEOUS | 0 refills | Status: AC | PRN

## 2018-05-28 NOTE — Progress Notes (Signed)
Subjective: Riley Ford is a 84 y.o. male patient who presents to office for evaluation of Left foot pain. Patient complains of progressive pain especially over the last 6 months in the Left foot at the 4-5 toes. Ranks pain 10/10 sharp pains off and on and is now interferring with daily activities and sometimes when walks the baby toe rolls down. Patient has tried foot spa with no relief in symptoms. Patient denies any other pedal complaints.   Patient Active Problem List   Diagnosis Date Noted  . Risk for falls 07/13/2015  . History of DVT (deep vein thrombosis) 07/06/2015  . Coronary atherosclerosis of native coronary artery 09/16/2014  . Essential hypertension, benign 09/16/2014  . Mixed hyperlipidemia 09/16/2014  . Old myocardial infarction 09/16/2014  . Gait difficulty 02/24/2014  . Anxiety 10/07/2013  . Neck pain 10/07/2013  . Parkinson's disease ([place]) 10/07/2013  . Pseudobulbar affect 10/07/2013  . Right-sided chest wall pain 10/07/2013    Current Outpatient Medications on File Prior to Visit  Medication Sig Dispense Refill  . ALFUZOSIN HCL ER PO Take 10 mg by mouth daily.    Marland Kitchen aspirin 81 MG chewable tablet Chew 81 mg by mouth daily.    Marland Kitchen atorvastatin (LIPITOR) 40 MG tablet Take 40 mg by mouth daily.    Marland Kitchen BENAZEPRIL HCL PO Take 5 mg by mouth daily.    . carbidopa-levodopa (SINEMET IR) 25-250 MG tablet Take 2 tablets by mouth 4 (four) times daily. 720 tablet 3  . diazepam (VALIUM) 10 MG tablet Take 10 mg by mouth every 8 (eight) hours as needed for anxiety.    . ferrous sulfate 325 (65 FE) MG tablet Take 325 mg by mouth daily with breakfast.    . HYDROcodone-acetaminophen (NORCO) 10-325 MG per tablet Take 1 tablet by mouth every 6 (six) hours as needed.    . IMIPRAMINE HCL PO Take 25 mg by mouth daily.    Marland Kitchen ketorolac (ACULAR LS) 0.4 % SOLN 1 drop 4 (four) times daily.    Marland Kitchen omeprazole (PRILOSEC) 40 MG capsule Take 40 mg by mouth daily.    Marland Kitchen oxyCODONE-acetaminophen (PERCOCET)  10-325 MG tablet     . Potassium Chloride ER 20 MEQ TBCR Take 20 mEq by mouth daily.    . rivaroxaban (XARELTO) 20 MG TABS tablet Take 20 mg by mouth daily with supper.    . rizatriptan (MAXALT) 10 MG tablet Take 10 mg by mouth as needed for migraine. May repeat in 2 hours if needed    . rOPINIRole (REQUIP) 0.25 MG tablet Take 0.25 mg by mouth 3 (three) times daily.     No current facility-administered medications on file prior to visit.     Allergies  Allergen Reactions  . Codeine Hives and Rash    Pt states he can tolerate codeine if he takes Benadryl with it.  jkl  . Ibuprofen Other (See Comments)    Kidney Disease  . Latex Rash  . Naproxen Other (See Comments)    Kidney Disease    Objective:  General: Alert and oriented x3 in no acute distress  Dermatology: No hyperkeratotic lesions overlying 2-5 PIPJ dorsally bilateral. No open lesions bilateral lower extremities, no webspace macerations, no ecchymosis bilateral, all nails x 10 are well manicured.  Vascular: Dorsalis Pedis and Posterior Tibial pedal pulses 1/4, Capillary Fill Time 5 seconds,(+) pedal hair growth bilateral, no edema bilateral lower extremities, Temperature gradient within normal limits.  Neurology: Gross sensation intact via light touch bilateral.  Musculoskeletal:  Semi-flexible hammertoes 2-5 with Mild tenderness with palpation at PIPJ L>R at 4-5 toes, No bunion deformity noted bilateral. No pain with calf compression bilateral.  Strength within 4/5 in all groups bilateral.   Gait:Shuffling consistent with Parkinsons History   Xrays  Left Foot    Impression:Normal osseous mineralization, hammertoe 2-5 deformity with no other acute findings.        Assessment and Plan: Problem List Items Addressed This Visit      Nervous and Auditory   Parkinson's disease ([place])     Other   Gait difficulty    Other Visit Diagnoses    Hammertoe of left foot    -  Primary   Left foot pain           -Complete  examination performed -Xrays reviewed -Discussed treatement options for hammertoes -Rx toe cushions and recommend wider shoes that do not rub the toes -Recommend topical lidocaine cream as needed for pain as Rx or Aspercreme if not covered  -Patient to return to office as needed or sooner if condition worsens.  Landis Martins, DPM

## 2018-06-18 DIAGNOSIS — G20 Parkinson's disease: Secondary | ICD-10-CM | POA: Diagnosis not present

## 2018-06-18 DIAGNOSIS — J449 Chronic obstructive pulmonary disease, unspecified: Secondary | ICD-10-CM | POA: Diagnosis not present

## 2018-07-17 DIAGNOSIS — R252 Cramp and spasm: Secondary | ICD-10-CM | POA: Diagnosis not present

## 2018-07-17 DIAGNOSIS — M5030 Other cervical disc degeneration, unspecified cervical region: Secondary | ICD-10-CM | POA: Diagnosis not present

## 2018-07-17 DIAGNOSIS — F419 Anxiety disorder, unspecified: Secondary | ICD-10-CM | POA: Diagnosis not present

## 2018-07-17 DIAGNOSIS — R419 Unspecified symptoms and signs involving cognitive functions and awareness: Secondary | ICD-10-CM | POA: Diagnosis not present

## 2018-07-17 DIAGNOSIS — M5136 Other intervertebral disc degeneration, lumbar region: Secondary | ICD-10-CM | POA: Diagnosis not present

## 2018-07-17 DIAGNOSIS — R251 Tremor, unspecified: Secondary | ICD-10-CM | POA: Diagnosis not present

## 2018-07-17 DIAGNOSIS — G20 Parkinson's disease: Secondary | ICD-10-CM | POA: Diagnosis not present

## 2018-07-17 DIAGNOSIS — M47816 Spondylosis without myelopathy or radiculopathy, lumbar region: Secondary | ICD-10-CM | POA: Diagnosis not present

## 2018-08-07 DIAGNOSIS — D509 Iron deficiency anemia, unspecified: Secondary | ICD-10-CM | POA: Diagnosis not present

## 2018-08-07 DIAGNOSIS — K5790 Diverticulosis of intestine, part unspecified, without perforation or abscess without bleeding: Secondary | ICD-10-CM | POA: Diagnosis not present

## 2018-08-07 DIAGNOSIS — K5900 Constipation, unspecified: Secondary | ICD-10-CM | POA: Diagnosis not present

## 2018-08-08 DIAGNOSIS — H26493 Other secondary cataract, bilateral: Secondary | ICD-10-CM | POA: Diagnosis not present

## 2018-08-18 ENCOUNTER — Telehealth: Payer: Medicare Other | Admitting: Cardiology

## 2018-08-18 ENCOUNTER — Other Ambulatory Visit: Payer: Self-pay

## 2018-08-29 DIAGNOSIS — R51 Headache: Secondary | ICD-10-CM | POA: Diagnosis not present

## 2018-08-29 DIAGNOSIS — G319 Degenerative disease of nervous system, unspecified: Secondary | ICD-10-CM | POA: Diagnosis not present

## 2018-08-29 DIAGNOSIS — R419 Unspecified symptoms and signs involving cognitive functions and awareness: Secondary | ICD-10-CM | POA: Diagnosis not present

## 2018-09-09 DIAGNOSIS — R419 Unspecified symptoms and signs involving cognitive functions and awareness: Secondary | ICD-10-CM | POA: Diagnosis not present

## 2018-09-11 DIAGNOSIS — J310 Chronic rhinitis: Secondary | ICD-10-CM | POA: Diagnosis not present

## 2018-09-11 DIAGNOSIS — R5383 Other fatigue: Secondary | ICD-10-CM | POA: Diagnosis not present

## 2018-09-11 DIAGNOSIS — J4530 Mild persistent asthma, uncomplicated: Secondary | ICD-10-CM | POA: Diagnosis not present

## 2018-09-11 DIAGNOSIS — G4733 Obstructive sleep apnea (adult) (pediatric): Secondary | ICD-10-CM | POA: Diagnosis not present

## 2018-09-18 DIAGNOSIS — Z20828 Contact with and (suspected) exposure to other viral communicable diseases: Secondary | ICD-10-CM | POA: Diagnosis not present

## 2018-09-28 DIAGNOSIS — Z711 Person with feared health complaint in whom no diagnosis is made: Secondary | ICD-10-CM | POA: Diagnosis not present

## 2018-09-28 DIAGNOSIS — G4733 Obstructive sleep apnea (adult) (pediatric): Secondary | ICD-10-CM | POA: Diagnosis not present

## 2018-10-01 DIAGNOSIS — J4530 Mild persistent asthma, uncomplicated: Secondary | ICD-10-CM | POA: Diagnosis not present

## 2018-10-01 DIAGNOSIS — J310 Chronic rhinitis: Secondary | ICD-10-CM | POA: Diagnosis not present

## 2018-10-01 DIAGNOSIS — R5383 Other fatigue: Secondary | ICD-10-CM | POA: Diagnosis not present

## 2018-10-17 DIAGNOSIS — R251 Tremor, unspecified: Secondary | ICD-10-CM | POA: Diagnosis not present

## 2018-10-17 DIAGNOSIS — R252 Cramp and spasm: Secondary | ICD-10-CM | POA: Diagnosis not present

## 2018-10-17 DIAGNOSIS — M5136 Other intervertebral disc degeneration, lumbar region: Secondary | ICD-10-CM | POA: Diagnosis not present

## 2018-10-17 DIAGNOSIS — R419 Unspecified symptoms and signs involving cognitive functions and awareness: Secondary | ICD-10-CM | POA: Diagnosis not present

## 2018-10-17 DIAGNOSIS — M47816 Spondylosis without myelopathy or radiculopathy, lumbar region: Secondary | ICD-10-CM | POA: Diagnosis not present

## 2018-10-17 DIAGNOSIS — F419 Anxiety disorder, unspecified: Secondary | ICD-10-CM | POA: Diagnosis not present

## 2018-10-17 DIAGNOSIS — M5030 Other cervical disc degeneration, unspecified cervical region: Secondary | ICD-10-CM | POA: Diagnosis not present

## 2018-10-17 DIAGNOSIS — G20 Parkinson's disease: Secondary | ICD-10-CM | POA: Diagnosis not present

## 2018-10-20 DIAGNOSIS — M47816 Spondylosis without myelopathy or radiculopathy, lumbar region: Secondary | ICD-10-CM | POA: Diagnosis not present

## 2018-11-07 DIAGNOSIS — J310 Chronic rhinitis: Secondary | ICD-10-CM | POA: Diagnosis not present

## 2018-11-07 DIAGNOSIS — R5383 Other fatigue: Secondary | ICD-10-CM | POA: Diagnosis not present

## 2018-11-07 DIAGNOSIS — J4530 Mild persistent asthma, uncomplicated: Secondary | ICD-10-CM | POA: Diagnosis not present

## 2018-11-10 DIAGNOSIS — Z6829 Body mass index (BMI) 29.0-29.9, adult: Secondary | ICD-10-CM | POA: Diagnosis not present

## 2018-11-10 DIAGNOSIS — D6859 Other primary thrombophilia: Secondary | ICD-10-CM | POA: Diagnosis not present

## 2018-11-10 DIAGNOSIS — Z1331 Encounter for screening for depression: Secondary | ICD-10-CM | POA: Diagnosis not present

## 2018-11-10 DIAGNOSIS — Z0000 Encounter for general adult medical examination without abnormal findings: Secondary | ICD-10-CM | POA: Diagnosis not present

## 2018-11-10 DIAGNOSIS — Z139 Encounter for screening, unspecified: Secondary | ICD-10-CM | POA: Diagnosis not present

## 2018-11-10 DIAGNOSIS — G20 Parkinson's disease: Secondary | ICD-10-CM | POA: Diagnosis not present

## 2018-11-11 DIAGNOSIS — E782 Mixed hyperlipidemia: Secondary | ICD-10-CM | POA: Diagnosis not present

## 2018-11-11 DIAGNOSIS — G20 Parkinson's disease: Secondary | ICD-10-CM | POA: Diagnosis not present

## 2018-11-11 DIAGNOSIS — I2510 Atherosclerotic heart disease of native coronary artery without angina pectoris: Secondary | ICD-10-CM | POA: Diagnosis not present

## 2018-11-11 DIAGNOSIS — Z7901 Long term (current) use of anticoagulants: Secondary | ICD-10-CM | POA: Diagnosis not present

## 2018-11-11 DIAGNOSIS — Z789 Other specified health status: Secondary | ICD-10-CM | POA: Diagnosis not present

## 2018-11-11 DIAGNOSIS — I10 Essential (primary) hypertension: Secondary | ICD-10-CM | POA: Diagnosis not present

## 2018-11-11 DIAGNOSIS — Z86718 Personal history of other venous thrombosis and embolism: Secondary | ICD-10-CM | POA: Diagnosis not present

## 2018-11-11 DIAGNOSIS — F0280 Dementia in other diseases classified elsewhere without behavioral disturbance: Secondary | ICD-10-CM | POA: Diagnosis not present

## 2018-11-11 DIAGNOSIS — Z7982 Long term (current) use of aspirin: Secondary | ICD-10-CM | POA: Diagnosis not present

## 2018-11-25 DIAGNOSIS — I2510 Atherosclerotic heart disease of native coronary artery without angina pectoris: Secondary | ICD-10-CM | POA: Diagnosis not present

## 2018-12-01 DIAGNOSIS — R4189 Other symptoms and signs involving cognitive functions and awareness: Secondary | ICD-10-CM | POA: Diagnosis not present

## 2018-12-01 DIAGNOSIS — G20 Parkinson's disease: Secondary | ICD-10-CM | POA: Diagnosis not present

## 2018-12-01 DIAGNOSIS — Z6829 Body mass index (BMI) 29.0-29.9, adult: Secondary | ICD-10-CM | POA: Diagnosis not present

## 2018-12-30 DIAGNOSIS — H26493 Other secondary cataract, bilateral: Secondary | ICD-10-CM | POA: Diagnosis not present

## 2019-01-01 DIAGNOSIS — N401 Enlarged prostate with lower urinary tract symptoms: Secondary | ICD-10-CM | POA: Diagnosis not present

## 2019-01-01 DIAGNOSIS — N319 Neuromuscular dysfunction of bladder, unspecified: Secondary | ICD-10-CM | POA: Diagnosis not present

## 2019-01-01 DIAGNOSIS — R351 Nocturia: Secondary | ICD-10-CM | POA: Diagnosis not present

## 2019-01-23 DIAGNOSIS — R251 Tremor, unspecified: Secondary | ICD-10-CM | POA: Diagnosis not present

## 2019-01-23 DIAGNOSIS — G20 Parkinson's disease: Secondary | ICD-10-CM | POA: Diagnosis not present

## 2019-01-23 DIAGNOSIS — R419 Unspecified symptoms and signs involving cognitive functions and awareness: Secondary | ICD-10-CM | POA: Diagnosis not present

## 2019-01-23 DIAGNOSIS — M5136 Other intervertebral disc degeneration, lumbar region: Secondary | ICD-10-CM | POA: Diagnosis not present

## 2019-01-23 DIAGNOSIS — M47816 Spondylosis without myelopathy or radiculopathy, lumbar region: Secondary | ICD-10-CM | POA: Diagnosis not present

## 2019-01-23 DIAGNOSIS — M5030 Other cervical disc degeneration, unspecified cervical region: Secondary | ICD-10-CM | POA: Diagnosis not present

## 2019-01-23 DIAGNOSIS — Z9181 History of falling: Secondary | ICD-10-CM | POA: Diagnosis not present

## 2019-01-23 DIAGNOSIS — F419 Anxiety disorder, unspecified: Secondary | ICD-10-CM | POA: Diagnosis not present

## 2019-01-23 DIAGNOSIS — G609 Hereditary and idiopathic neuropathy, unspecified: Secondary | ICD-10-CM | POA: Diagnosis not present

## 2019-03-18 DIAGNOSIS — G20 Parkinson's disease: Secondary | ICD-10-CM | POA: Diagnosis not present

## 2019-03-18 DIAGNOSIS — E785 Hyperlipidemia, unspecified: Secondary | ICD-10-CM | POA: Diagnosis not present

## 2019-03-18 DIAGNOSIS — I2510 Atherosclerotic heart disease of native coronary artery without angina pectoris: Secondary | ICD-10-CM | POA: Diagnosis not present

## 2019-03-18 DIAGNOSIS — N400 Enlarged prostate without lower urinary tract symptoms: Secondary | ICD-10-CM | POA: Diagnosis not present

## 2019-03-18 DIAGNOSIS — Z23 Encounter for immunization: Secondary | ICD-10-CM | POA: Diagnosis not present

## 2019-03-18 DIAGNOSIS — Z5181 Encounter for therapeutic drug level monitoring: Secondary | ICD-10-CM | POA: Diagnosis not present

## 2019-04-28 DIAGNOSIS — R419 Unspecified symptoms and signs involving cognitive functions and awareness: Secondary | ICD-10-CM | POA: Diagnosis not present

## 2019-04-28 DIAGNOSIS — M5136 Other intervertebral disc degeneration, lumbar region: Secondary | ICD-10-CM | POA: Diagnosis not present

## 2019-04-28 DIAGNOSIS — G20 Parkinson's disease: Secondary | ICD-10-CM | POA: Diagnosis not present

## 2019-04-28 DIAGNOSIS — Z9181 History of falling: Secondary | ICD-10-CM | POA: Diagnosis not present

## 2019-04-28 DIAGNOSIS — M5030 Other cervical disc degeneration, unspecified cervical region: Secondary | ICD-10-CM | POA: Diagnosis not present

## 2019-04-28 DIAGNOSIS — G609 Hereditary and idiopathic neuropathy, unspecified: Secondary | ICD-10-CM | POA: Diagnosis not present

## 2019-04-28 DIAGNOSIS — F419 Anxiety disorder, unspecified: Secondary | ICD-10-CM | POA: Diagnosis not present

## 2019-04-28 DIAGNOSIS — R251 Tremor, unspecified: Secondary | ICD-10-CM | POA: Diagnosis not present

## 2019-04-28 DIAGNOSIS — M47816 Spondylosis without myelopathy or radiculopathy, lumbar region: Secondary | ICD-10-CM | POA: Diagnosis not present

## 2019-04-28 DIAGNOSIS — R413 Other amnesia: Secondary | ICD-10-CM | POA: Diagnosis not present

## 2019-05-06 DIAGNOSIS — Z20828 Contact with and (suspected) exposure to other viral communicable diseases: Secondary | ICD-10-CM | POA: Diagnosis not present

## 2019-05-06 DIAGNOSIS — Z7189 Other specified counseling: Secondary | ICD-10-CM | POA: Diagnosis not present

## 2019-05-14 DIAGNOSIS — E782 Mixed hyperlipidemia: Secondary | ICD-10-CM | POA: Diagnosis not present

## 2019-05-14 DIAGNOSIS — Z7982 Long term (current) use of aspirin: Secondary | ICD-10-CM | POA: Diagnosis not present

## 2019-05-14 DIAGNOSIS — Z86718 Personal history of other venous thrombosis and embolism: Secondary | ICD-10-CM | POA: Diagnosis not present

## 2019-05-14 DIAGNOSIS — I10 Essential (primary) hypertension: Secondary | ICD-10-CM | POA: Diagnosis not present

## 2019-05-14 DIAGNOSIS — G20 Parkinson's disease: Secondary | ICD-10-CM | POA: Diagnosis not present

## 2019-05-14 DIAGNOSIS — I2510 Atherosclerotic heart disease of native coronary artery without angina pectoris: Secondary | ICD-10-CM | POA: Diagnosis not present

## 2019-05-14 DIAGNOSIS — Z789 Other specified health status: Secondary | ICD-10-CM | POA: Diagnosis not present

## 2019-05-14 DIAGNOSIS — Z7901 Long term (current) use of anticoagulants: Secondary | ICD-10-CM | POA: Diagnosis not present

## 2019-05-14 DIAGNOSIS — F0280 Dementia in other diseases classified elsewhere without behavioral disturbance: Secondary | ICD-10-CM | POA: Diagnosis not present

## 2019-06-05 DIAGNOSIS — R5383 Other fatigue: Secondary | ICD-10-CM | POA: Diagnosis not present

## 2019-06-24 DIAGNOSIS — R410 Disorientation, unspecified: Secondary | ICD-10-CM | POA: Diagnosis not present

## 2019-06-24 DIAGNOSIS — M549 Dorsalgia, unspecified: Secondary | ICD-10-CM | POA: Diagnosis not present

## 2019-07-06 DIAGNOSIS — E860 Dehydration: Secondary | ICD-10-CM | POA: Diagnosis not present

## 2019-07-06 DIAGNOSIS — K921 Melena: Secondary | ICD-10-CM | POA: Diagnosis not present

## 2019-07-06 DIAGNOSIS — R1032 Left lower quadrant pain: Secondary | ICD-10-CM | POA: Diagnosis not present

## 2019-07-06 DIAGNOSIS — Z6829 Body mass index (BMI) 29.0-29.9, adult: Secondary | ICD-10-CM | POA: Diagnosis not present

## 2019-07-08 DIAGNOSIS — Z6829 Body mass index (BMI) 29.0-29.9, adult: Secondary | ICD-10-CM | POA: Diagnosis not present

## 2019-07-08 DIAGNOSIS — R1032 Left lower quadrant pain: Secondary | ICD-10-CM | POA: Diagnosis not present

## 2019-07-13 DIAGNOSIS — M47815 Spondylosis without myelopathy or radiculopathy, thoracolumbar region: Secondary | ICD-10-CM | POA: Diagnosis not present

## 2019-07-13 DIAGNOSIS — I700 Atherosclerosis of aorta: Secondary | ICD-10-CM | POA: Diagnosis not present

## 2019-07-13 DIAGNOSIS — M5135 Other intervertebral disc degeneration, thoracolumbar region: Secondary | ICD-10-CM | POA: Diagnosis not present

## 2019-07-13 DIAGNOSIS — I2510 Atherosclerotic heart disease of native coronary artery without angina pectoris: Secondary | ICD-10-CM | POA: Diagnosis not present

## 2019-07-13 DIAGNOSIS — N281 Cyst of kidney, acquired: Secondary | ICD-10-CM | POA: Diagnosis not present

## 2019-07-13 DIAGNOSIS — R1032 Left lower quadrant pain: Secondary | ICD-10-CM | POA: Diagnosis not present

## 2019-07-27 DIAGNOSIS — Z6830 Body mass index (BMI) 30.0-30.9, adult: Secondary | ICD-10-CM | POA: Diagnosis not present

## 2019-07-27 DIAGNOSIS — I700 Atherosclerosis of aorta: Secondary | ICD-10-CM | POA: Diagnosis not present

## 2019-07-27 DIAGNOSIS — R911 Solitary pulmonary nodule: Secondary | ICD-10-CM | POA: Diagnosis not present

## 2019-07-27 DIAGNOSIS — K5900 Constipation, unspecified: Secondary | ICD-10-CM | POA: Diagnosis not present

## 2019-08-12 DIAGNOSIS — J4530 Mild persistent asthma, uncomplicated: Secondary | ICD-10-CM | POA: Diagnosis not present

## 2019-08-12 DIAGNOSIS — R5383 Other fatigue: Secondary | ICD-10-CM | POA: Diagnosis not present

## 2019-08-12 DIAGNOSIS — J310 Chronic rhinitis: Secondary | ICD-10-CM | POA: Diagnosis not present

## 2019-08-12 DIAGNOSIS — R918 Other nonspecific abnormal finding of lung field: Secondary | ICD-10-CM | POA: Diagnosis not present

## 2019-08-19 DIAGNOSIS — R911 Solitary pulmonary nodule: Secondary | ICD-10-CM | POA: Diagnosis not present

## 2019-08-19 DIAGNOSIS — R918 Other nonspecific abnormal finding of lung field: Secondary | ICD-10-CM | POA: Diagnosis not present

## 2019-10-26 DIAGNOSIS — Z20822 Contact with and (suspected) exposure to covid-19: Secondary | ICD-10-CM | POA: Diagnosis not present

## 2019-10-26 DIAGNOSIS — U071 COVID-19: Secondary | ICD-10-CM | POA: Diagnosis not present

## 2019-11-01 DIAGNOSIS — Z66 Do not resuscitate: Secondary | ICD-10-CM | POA: Diagnosis not present

## 2019-11-01 DIAGNOSIS — A4189 Other specified sepsis: Secondary | ICD-10-CM | POA: Diagnosis not present

## 2019-11-01 DIAGNOSIS — I248 Other forms of acute ischemic heart disease: Secondary | ICD-10-CM | POA: Diagnosis present

## 2019-11-01 DIAGNOSIS — R7401 Elevation of levels of liver transaminase levels: Secondary | ICD-10-CM | POA: Diagnosis not present

## 2019-11-01 DIAGNOSIS — Z86718 Personal history of other venous thrombosis and embolism: Secondary | ICD-10-CM | POA: Diagnosis not present

## 2019-11-01 DIAGNOSIS — G20 Parkinson's disease: Secondary | ICD-10-CM | POA: Diagnosis present

## 2019-11-01 DIAGNOSIS — I491 Atrial premature depolarization: Secondary | ICD-10-CM | POA: Diagnosis not present

## 2019-11-01 DIAGNOSIS — J90 Pleural effusion, not elsewhere classified: Secondary | ICD-10-CM | POA: Diagnosis not present

## 2019-11-01 DIAGNOSIS — F0390 Unspecified dementia without behavioral disturbance: Secondary | ICD-10-CM | POA: Diagnosis not present

## 2019-11-01 DIAGNOSIS — I517 Cardiomegaly: Secondary | ICD-10-CM | POA: Diagnosis not present

## 2019-11-01 DIAGNOSIS — B1920 Unspecified viral hepatitis C without hepatic coma: Secondary | ICD-10-CM | POA: Diagnosis present

## 2019-11-01 DIAGNOSIS — J449 Chronic obstructive pulmonary disease, unspecified: Secondary | ICD-10-CM | POA: Diagnosis not present

## 2019-11-01 DIAGNOSIS — R0602 Shortness of breath: Secondary | ICD-10-CM | POA: Diagnosis not present

## 2019-11-01 DIAGNOSIS — U071 COVID-19: Secondary | ICD-10-CM | POA: Diagnosis not present

## 2019-11-01 DIAGNOSIS — I2510 Atherosclerotic heart disease of native coronary artery without angina pectoris: Secondary | ICD-10-CM | POA: Diagnosis present

## 2019-11-01 DIAGNOSIS — N183 Chronic kidney disease, stage 3 unspecified: Secondary | ICD-10-CM | POA: Diagnosis present

## 2019-11-01 DIAGNOSIS — R0600 Dyspnea, unspecified: Secondary | ICD-10-CM | POA: Diagnosis not present

## 2019-11-01 DIAGNOSIS — J440 Chronic obstructive pulmonary disease with acute lower respiratory infection: Secondary | ICD-10-CM | POA: Diagnosis present

## 2019-11-01 DIAGNOSIS — R0902 Hypoxemia: Secondary | ICD-10-CM | POA: Diagnosis not present

## 2019-11-01 DIAGNOSIS — I5032 Chronic diastolic (congestive) heart failure: Secondary | ICD-10-CM | POA: Diagnosis present

## 2019-11-01 DIAGNOSIS — Z95828 Presence of other vascular implants and grafts: Secondary | ICD-10-CM | POA: Diagnosis not present

## 2019-11-01 DIAGNOSIS — Z87891 Personal history of nicotine dependence: Secondary | ICD-10-CM | POA: Diagnosis not present

## 2019-11-01 DIAGNOSIS — I959 Hypotension, unspecified: Secondary | ICD-10-CM | POA: Diagnosis not present

## 2019-11-01 DIAGNOSIS — J1282 Pneumonia due to coronavirus disease 2019: Secondary | ICD-10-CM | POA: Diagnosis not present

## 2019-11-01 DIAGNOSIS — Z9181 History of falling: Secondary | ICD-10-CM | POA: Diagnosis not present

## 2019-11-01 DIAGNOSIS — F0280 Dementia in other diseases classified elsewhere without behavioral disturbance: Secondary | ICD-10-CM | POA: Diagnosis present

## 2019-11-01 DIAGNOSIS — J9601 Acute respiratory failure with hypoxia: Secondary | ICD-10-CM | POA: Diagnosis present

## 2019-11-01 DIAGNOSIS — R0689 Other abnormalities of breathing: Secondary | ICD-10-CM | POA: Diagnosis not present

## 2019-11-01 DIAGNOSIS — I252 Old myocardial infarction: Secondary | ICD-10-CM | POA: Diagnosis not present

## 2019-11-01 DIAGNOSIS — R6520 Severe sepsis without septic shock: Secondary | ICD-10-CM | POA: Diagnosis present

## 2019-11-01 DIAGNOSIS — E782 Mixed hyperlipidemia: Secondary | ICD-10-CM | POA: Diagnosis present

## 2019-11-01 DIAGNOSIS — J9691 Respiratory failure, unspecified with hypoxia: Secondary | ICD-10-CM | POA: Diagnosis not present

## 2019-11-01 DIAGNOSIS — Z7901 Long term (current) use of anticoagulants: Secondary | ICD-10-CM | POA: Diagnosis not present

## 2019-11-01 DIAGNOSIS — D6959 Other secondary thrombocytopenia: Secondary | ICD-10-CM | POA: Diagnosis present

## 2019-11-01 DIAGNOSIS — Z9981 Dependence on supplemental oxygen: Secondary | ICD-10-CM | POA: Diagnosis not present

## 2019-11-01 DIAGNOSIS — N179 Acute kidney failure, unspecified: Secondary | ICD-10-CM | POA: Diagnosis present

## 2019-11-01 DIAGNOSIS — E860 Dehydration: Secondary | ICD-10-CM | POA: Diagnosis present

## 2019-11-01 DIAGNOSIS — I5030 Unspecified diastolic (congestive) heart failure: Secondary | ICD-10-CM | POA: Diagnosis not present

## 2019-11-01 DIAGNOSIS — F419 Anxiety disorder, unspecified: Secondary | ICD-10-CM | POA: Diagnosis present

## 2019-11-01 DIAGNOSIS — I130 Hypertensive heart and chronic kidney disease with heart failure and stage 1 through stage 4 chronic kidney disease, or unspecified chronic kidney disease: Secondary | ICD-10-CM | POA: Diagnosis present

## 2019-11-01 DIAGNOSIS — Z515 Encounter for palliative care: Secondary | ICD-10-CM | POA: Diagnosis not present

## 2019-12-11 DEATH — deceased
